# Patient Record
Sex: Female | Born: 2000 | Race: White | Hispanic: No | Marital: Married | Smoking: Never smoker
Health system: Southern US, Community
[De-identification: ages and names within clinical notes are randomized; demographics above are authoritative.]

## PROBLEM LIST (undated history)

## (undated) DIAGNOSIS — Z789 Other specified health status: Secondary | ICD-10-CM

## (undated) HISTORY — PX: OTHER SURGICAL HISTORY: SHX169

## (undated) HISTORY — DX: Other specified health status: Z78.9

---
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
## 2021-03-24 DIAGNOSIS — M25362 Other instability, left knee: Secondary | ICD-10-CM | POA: Diagnosis not present

## 2021-03-24 DIAGNOSIS — M2202 Recurrent dislocation of patella, left knee: Secondary | ICD-10-CM | POA: Diagnosis not present

## 2021-03-24 DIAGNOSIS — M794 Hypertrophy of (infrapatellar) fat pad: Secondary | ICD-10-CM | POA: Diagnosis not present

## 2021-03-24 DIAGNOSIS — M2352 Chronic instability of knee, left knee: Secondary | ICD-10-CM | POA: Diagnosis not present

## 2021-03-24 DIAGNOSIS — M65862 Other synovitis and tenosynovitis, left lower leg: Secondary | ICD-10-CM | POA: Diagnosis not present

## 2021-03-24 DIAGNOSIS — G8918 Other acute postprocedural pain: Secondary | ICD-10-CM | POA: Diagnosis not present

## 2021-03-31 DIAGNOSIS — M25562 Pain in left knee: Secondary | ICD-10-CM | POA: Insufficient documentation

## 2021-04-05 DIAGNOSIS — M25562 Pain in left knee: Secondary | ICD-10-CM | POA: Diagnosis not present

## 2021-04-07 DIAGNOSIS — M25562 Pain in left knee: Secondary | ICD-10-CM | POA: Diagnosis not present

## 2021-04-12 DIAGNOSIS — M25562 Pain in left knee: Secondary | ICD-10-CM | POA: Diagnosis not present

## 2021-04-15 DIAGNOSIS — M25562 Pain in left knee: Secondary | ICD-10-CM | POA: Diagnosis not present

## 2021-04-22 DIAGNOSIS — M25562 Pain in left knee: Secondary | ICD-10-CM | POA: Diagnosis not present

## 2021-04-26 DIAGNOSIS — M25562 Pain in left knee: Secondary | ICD-10-CM | POA: Diagnosis not present

## 2021-04-29 DIAGNOSIS — M25562 Pain in left knee: Secondary | ICD-10-CM | POA: Diagnosis not present

## 2021-05-06 DIAGNOSIS — M25562 Pain in left knee: Secondary | ICD-10-CM | POA: Diagnosis not present

## 2021-05-10 DIAGNOSIS — M25562 Pain in left knee: Secondary | ICD-10-CM | POA: Diagnosis not present

## 2021-05-13 DIAGNOSIS — M25562 Pain in left knee: Secondary | ICD-10-CM | POA: Diagnosis not present

## 2021-05-17 DIAGNOSIS — M25562 Pain in left knee: Secondary | ICD-10-CM | POA: Diagnosis not present

## 2021-05-20 DIAGNOSIS — M25562 Pain in left knee: Secondary | ICD-10-CM | POA: Diagnosis not present

## 2021-05-31 DIAGNOSIS — M25562 Pain in left knee: Secondary | ICD-10-CM | POA: Diagnosis not present

## 2021-05-31 DIAGNOSIS — M24462 Recurrent dislocation, left knee: Secondary | ICD-10-CM | POA: Diagnosis not present

## 2021-06-07 DIAGNOSIS — M25562 Pain in left knee: Secondary | ICD-10-CM | POA: Diagnosis not present

## 2021-06-10 DIAGNOSIS — M25562 Pain in left knee: Secondary | ICD-10-CM | POA: Diagnosis not present

## 2021-06-14 DIAGNOSIS — M25562 Pain in left knee: Secondary | ICD-10-CM | POA: Diagnosis not present

## 2021-06-16 DIAGNOSIS — M25562 Pain in left knee: Secondary | ICD-10-CM | POA: Diagnosis not present

## 2021-07-01 DIAGNOSIS — L03116 Cellulitis of left lower limb: Secondary | ICD-10-CM | POA: Diagnosis not present

## 2021-08-12 DIAGNOSIS — Z9889 Other specified postprocedural states: Secondary | ICD-10-CM

## 2021-08-12 HISTORY — DX: Other specified postprocedural states: Z98.890

## 2021-10-18 DIAGNOSIS — Z3201 Encounter for pregnancy test, result positive: Secondary | ICD-10-CM | POA: Diagnosis not present

## 2021-11-03 DIAGNOSIS — Z3400 Encounter for supervision of normal first pregnancy, unspecified trimester: Secondary | ICD-10-CM | POA: Diagnosis not present

## 2021-11-08 DIAGNOSIS — O26891 Other specified pregnancy related conditions, first trimester: Secondary | ICD-10-CM | POA: Diagnosis not present

## 2021-11-10 ENCOUNTER — Ambulatory Visit: Payer: BC Managed Care – PPO

## 2021-11-17 DIAGNOSIS — Z3401 Encounter for supervision of normal first pregnancy, first trimester: Secondary | ICD-10-CM | POA: Diagnosis not present

## 2021-11-23 ENCOUNTER — Other Ambulatory Visit: Payer: BC Managed Care – PPO

## 2021-12-23 DIAGNOSIS — Z3480 Encounter for supervision of other normal pregnancy, unspecified trimester: Secondary | ICD-10-CM | POA: Insufficient documentation

## 2021-12-23 DIAGNOSIS — Z3490 Encounter for supervision of normal pregnancy, unspecified, unspecified trimester: Secondary | ICD-10-CM | POA: Insufficient documentation

## 2021-12-23 NOTE — Progress Notes (Unsigned)
New OB Intake  I connected with  Jane Cordova on 12/24/21 at 11:15 AM EST by telephone and verified that I am speaking with the correct person using two identifiers. Nurse is located at [facility] and pt is located at home.  I explained I am completing New OB Intake today. We discussed her EDD of 06/17/22 that is based on LMP of 09/10/21. Pt is G1/P0. I reviewed her allergies, medications, Medical/Surgical/OB history, and appropriate screenings. Based on history, this is a/an pregnancy uncomplicated .   There are no problems to display for this patient.   Concerns addressed today: None  Delivery Plans:  Plans to deliver at [facility].  Anatomy US Explained  Anatomy US will be at 20 weeks.  Labs Discussed genetic screening with patient. Patient denies genetic testing to be drawn with new OB labs. Discussed possible labs to be drawn at new OB appointment.  COVID Vaccine Patient has not had COVID vaccine.   Social Determinants of Health Food Insecurity: denies food insecurity Transportation: Patient denies transportation needs. Childcare: Discussed no children allowed at ultrasound appointments.   First visit review I reviewed new OB appt with pt. I explained she will have bloodwork and pap smear/pelvic exam if indicated. Explained pt will be seen by Tresea Mall, CNM at first visit; encounter routed to appropriate provider.   Donnetta Hail, CMA 12/24/2021  11:15 AM

## 2021-12-24 ENCOUNTER — Ambulatory Visit (INDEPENDENT_AMBULATORY_CARE_PROVIDER_SITE_OTHER): Payer: Medicaid Other

## 2021-12-24 VITALS — Ht 67.0 in

## 2021-12-24 DIAGNOSIS — Z3689 Encounter for other specified antenatal screening: Secondary | ICD-10-CM

## 2021-12-24 DIAGNOSIS — Z3480 Encounter for supervision of other normal pregnancy, unspecified trimester: Secondary | ICD-10-CM

## 2021-12-27 ENCOUNTER — Other Ambulatory Visit: Payer: BC Managed Care – PPO

## 2021-12-29 ENCOUNTER — Other Ambulatory Visit: Payer: Self-pay | Admitting: Advanced Practice Midwife

## 2021-12-29 DIAGNOSIS — Z3402 Encounter for supervision of normal first pregnancy, second trimester: Secondary | ICD-10-CM

## 2021-12-29 DIAGNOSIS — Z369 Encounter for antenatal screening, unspecified: Secondary | ICD-10-CM

## 2021-12-29 NOTE — Progress Notes (Signed)
Anatomy scan ordered

## 2022-01-04 ENCOUNTER — Ambulatory Visit (INDEPENDENT_AMBULATORY_CARE_PROVIDER_SITE_OTHER): Payer: Medicaid Other | Admitting: Advanced Practice Midwife

## 2022-01-04 ENCOUNTER — Other Ambulatory Visit (HOSPITAL_COMMUNITY)
Admission: RE | Admit: 2022-01-04 | Discharge: 2022-01-04 | Disposition: A | Discharge status: Home / Self Care | Payer: Medicaid Other | Source: Ambulatory Visit | Attending: Advanced Practice Midwife | Admitting: Advanced Practice Midwife

## 2022-01-04 ENCOUNTER — Encounter: Payer: Self-pay | Admitting: Advanced Practice Midwife

## 2022-01-04 VITALS — BP 90/60 | HR 87 | Wt 149.0 lb

## 2022-01-04 DIAGNOSIS — Z3402 Encounter for supervision of normal first pregnancy, second trimester: Secondary | ICD-10-CM | POA: Diagnosis not present

## 2022-01-04 DIAGNOSIS — Z113 Encounter for screening for infections with a predominantly sexual mode of transmission: Secondary | ICD-10-CM | POA: Insufficient documentation

## 2022-01-04 DIAGNOSIS — Z3A16 16 weeks gestation of pregnancy: Secondary | ICD-10-CM | POA: Diagnosis not present

## 2022-01-04 LAB — POCT URINALYSIS DIPSTICK OB
Bilirubin, UA: NEGATIVE
Blood, UA: NEGATIVE
Glucose, UA: NEGATIVE
Ketones, UA: NEGATIVE
Leukocytes, UA: NEGATIVE
Nitrite, UA: NEGATIVE
POC,PROTEIN,UA: NEGATIVE
Spec Grav, UA: 1.01 (ref 1.010–1.025)
Urobilinogen, UA: 1 E.U./dL
pH, UA: 6.5 (ref 5.0–8.0)

## 2022-01-04 NOTE — Progress Notes (Unsigned)
New Obstetric Patient H&P  Transfer of care from [facility] in [place]: "Desires prenatal care"   History of Present Illness: Patient is a 21 y.o. G1P0 Not Hispanic or Latino female, presents with amenorrhea and positive home pregnancy test. Patient's last menstrual period was 09/10/2021 (approximate). and based on her  LMP, her EDD is Estimated Date of Delivery: 06/16/22 and her EGA is [redacted]w[redacted]d Cycles are {0-35:19561} {days/wks/mos/yrs:310907}, {Desc; regular/irreg:14544}, and occur approximately every : {numbers 22-35:14824} days. Her last pap smear was {numbers (fuzzy):14653} years ago and was {Findings; lab pap smear results:16707::"no abnormalities"}.    She had a urine pregnancy test which was positive {numbers (fuzzy):14653} {time frame:9076}  ago. Her last menstrual period was normal and lasted for  {numbers (fuzzy):14653} {time frame:9076}. Since her LMP she claims she has experienced ***. She denies vaginal bleeding. Her past medical history is {Noncontribuatory/Contributory:21644}. Her prior pregnancies are notable for {pregnancy complications:12320}  Since her LMP, she admits to the use of tobacco products  {yes/no:63} She claims she has gained   {inf wt change:14817} pounds since the start of her pregnancy.  There are cats in the home in the home  {yes/no:63} If yes {Desc; indoor/outdoor:13239} She admits close contact with children on a regular basis  {yes/no:63}  She has had chicken pox in the past {yes/no/unknown:74} She has had Tuberculosis exposures, symptoms, or previously tested positive for TB   {yes/no:63} Current or past history of domestic violence. {yes/no:63}  Genetic Screening/Teratology Counseling: (Includes patient, baby's father, or anyone in either family with:)   1. [facility] {yes/no:63} 2. Thalassemia ([place] or [place] background): MCV<80  {yes/no:63} 3. Neural tube defect (meningomyelocele, spina  bifida, anencephaly)  {yes/no:63} 4. Congenital heart defect  {yes/no:63}  5. Down syndrome  {yes/no:63} 6. Tay-Sachs (Jewish, [place]  {yes/no:63} 7. Canavan's Disease  {yes/no:63} 8. Sickle cell disease or trait (African)  {yes/no:63}  9. Hemophilia or other blood disorders  {yes/no:63}  10. Muscular dystrophy  {yes/no:63}  11. Cystic fibrosis  {yes/no:63}  12. Huntington's Chorea  {yes/no:63}  13. Mental retardation/autism  {yes/no:63} 14. Other inherited genetic or chromosomal disorder  {yes/no:63} 15. Maternal metabolic disorder (DM, PKU, etc)  {yes/no:63} 16. Patient or FOB with a child with a birth defect not listed above no  16a. Patient or FOB with a birth defect themselves {yes/no:63} 128 Recurrent pregnancy loss, or stillbirth  {yes/no:63}  18. Any medications since LMP other than prenatal vitamins (include vitamins, supplements, OTC meds, drugs, alcohol)  {yes/no:63} 19. Any other genetic/environmental exposure to discuss  {yes/no:63}  Infection History:   1. Lives with someone with TB or TB exposed  {yes/no:63}  2. Patient or partner has history of genital herpes  {yes/no:63} 3. Rash or viral illness since LMP  {yes/no:63} 4. History of STI (GC, CT, HPV, syphilis, HIV)  {yes/no:63} 5. History of recent travel :  {yes/no:63}  Other pertinent information:  {yes/no:63}     Review of Systems:10 point review of systems negative unless otherwise noted in HPI  Past Medical History:  Patient Active Problem List   Diagnosis Date Noted   Supervision of normal pregnancy 12/23/2021     Clinical Staff Provider  Office Location  [facility] Dating  Not found.  Language  English Anatomy [place]   Flu Vaccine  Offer Genetic Screen  NIPS:   TDaP vaccine   Offer Hgb A1C or  GTT Early : Third trimester :   Covid  Declines   LAB RESULTS   Rhogam     Blood Type     Feeding Plan Breastfeed Antibody    Contraception Declines Rubella    Circumcision Yes RPR      Pediatrician  Undecided HBsAg     Support Person Dorothea Ogle and Mom HIV    Prenatal Classes Yes Varicella     GBS  (For PCN allergy, check sensitivities)   BTL Consent  Hep C     VBAC Consent  Pap No results found for: "DIAGPAP"    Hgb Electro      CF      SMA            History of arthroscopy of knee 08/12/2021   Pain in joint of left knee 03/31/2021    Past Surgical History:  Past Surgical History:  Procedure Laterality Date   OTHER SURGICAL HISTORY     Ligament Reconstruction on Left Knee    Gynecologic History: Patient's last menstrual period was 09/10/2021 (approximate).  Obstetric History: G1P0  Family History:  Family History  Problem Relation Age of Onset   Healthy Father    Healthy Brother    Diabetes Maternal Grandmother    Heart Problems Maternal Grandmother    Diabetes Maternal Grandfather    Lung cancer Paternal Grandmother    Skin cancer Paternal Grandmother    Skin cancer Paternal Grandfather    Breast cancer Maternal Great-grandmother 42   Multiple myeloma Maternal Great-grandmother 65    Social History:  Social History   Socioeconomic History   Marital status: Single    Spouse name: Not on file   Number of children: 0   Years of education: 13   Highest education level: High school graduate  Occupational History   Occupation: Stay At Home  Tobacco Use   Smoking status: Never   Smokeless tobacco: Never  Vaping Use   Vaping Use: Former   Substances: Nicotine  Substance and Sexual Activity   Alcohol use: Not Currently   Drug use: Not Currently   Sexual activity: Yes    Partners: Male    Birth control/protection: None  Other Topics Concern   Not on file  Social History Narrative   Not on file   Social Determinants of Health   Financial Resource Strain: Low Risk  (12/24/2021)   Overall Financial Resource Strain (CARDIA)    Difficulty of Paying Living Expenses: Not very hard  Food Insecurity: No Food Insecurity (12/24/2021)   Hunger  Vital Sign    Worried About Running Out of Food in the Last Year: Never true    Ran Out of Food in the Last Year: Never true  Transportation Needs: No Transportation Needs (12/24/2021)   PRAPARE - Hydrologist (Medical): No    Lack of Transportation (Non-Medical): No  Physical Activity: Insufficiently Active (12/24/2021)   Exercise Vital Sign    Days of Exercise per Week: 1 day    Minutes of Exercise per Session: 10 min  Stress: No Stress Concern Present (12/24/2021)   [place]    Feeling of Stress : Only a little  Social Connections: Moderately Integrated (12/24/2021)   Social Connection and Isolation Panel [NHANES]    Frequency of Communication with Friends and Family: More than three times a week    Frequency of Social Gatherings with Friends and Family: Twice a week    Attends Religious Services: More than 4 times per  year    Active Member of Clubs or Organizations: No    Attends Archivist Meetings: Never    Marital Status: Living with partner  Intimate Partner Violence: Not At Risk (12/24/2021)   Humiliation, Afraid, Rape, and Kick questionnaire    Fear of Current or Ex-Partner: No    Emotionally Abused: No    Physically Abused: No    Sexually Abused: No    Allergies:  No Known Allergies  Medications: Prior to Admission medications   Medication Sig Start Date End Date Taking? Authorizing Provider  Prenatal Vit-Fe Fumarate-FA (PRENATAL PO) Take by mouth.   Yes [provider]    Physical Exam Vitals: Blood pressure 90/60, pulse 87, weight 149 lb (67.6 kg), last menstrual period 09/10/2021.  General: NAD HEENT: normocephalic, anicteric Thyroid: no enlargement, no palpable nodules Pulmonary: No increased work of breathing, CTAB Cardiovascular: RRR, distal pulses 2+ Abdomen: NABS, soft, non-tender, non-distended.  Umbilicus without lesions.  No hepatomegaly,  splenomegaly or masses palpable. No evidence of hernia  Genitourinary:  External: Normal external female genitalia.  Normal urethral meatus, normal  Bartholin's and Skene's glands.    Vagina: Normal vaginal mucosa, no evidence of prolapse.    Cervix: Grossly normal in appearance, no bleeding  Uterus: *** Non-enlarged, mobile, normal contour.  No CMT  Adnexa: ovaries non-enlarged, no adnexal masses  Rectal: deferred Extremities: no edema, erythema, or tenderness Neurologic: Grossly intact Psychiatric: mood appropriate, affect full   The following were addressed during this visit:  Breastfeeding Education - Early initiation of breastfeeding    Comments: Keeps milk supply adequate, helps contract uterus and slow bleeding, and early milk is the perfect first food and is easy to digest.   - The importance of exclusive breastfeeding    Comments: Provides antibodies, Lower risk of breast and ovarian cancers, and type-2 diabetes,Helps your body recover, Reduced chance of SIDS.   - Risks of giving your baby anything other than breast milk if you are breastfeeding    Comments: Make the baby less content with breastfeeds, may make my baby more susceptible to illness, and may reduce my milk supply.   - The importance of early skin-to-skin contact    Comments:  Keeps baby warm and secure, helps keep baby's blood sugar up and breathing steady, easier to bond and breastfeed, and helps calm baby.  - Rooming-in on a 24-hour basis    Comments: Easier to learn baby's feeding cues, easier to bond and get to know each other, and encourages milk production.   - Feeding on demand or baby-led feeding    Comments: Helps prevent breastfeeding complications, helps bring in good milk supply, prevents under or overfeeding, and helps baby feel content and satisfied   - Frequent feeding to help assure optimal milk production    Comments: Making a full supply of milk requires frequent removal of milk from  breasts, infant will eat 8-12 times in 24 hours, if separated from infant use breast massage, hand expression and/ or pumping to remove milk from breasts.   - Effective positioning and attachment    Comments: Helps my baby to get enough breast milk, helps to produce an adequate milk supply, and helps prevent nipple pain and damage   - Exclusive breastfeeding for the first 6 months    Comments: Builds a healthy milk supply and keeps it up, protects baby from sickness and disease, and breastmilk has everything your baby needs for the first 6 months.    Assessment: 20  y.o. G1P0 at [id number] to initiate prenatal care  Plan: 1) Avoid alcoholic beverages. 2) Patient encouraged not to smoke.  3) Discontinue the use of all non-medicinal drugs and chemicals.  4) Take prenatal vitamins daily.  5) Nutrition, food safety (fish, cheese advisories, and high nitrite foods) and exercise discussed. 6) Hospital and practice style discussed with cross coverage system.  7) Genetic Screening, such as with 1st Trimester Screening, cell free fetal DNA, AFP testing, and Ultrasound, as well as with amniocentesis and CVS as appropriate, is discussed with patient. At the conclusion of today's visit patient {Desc; requested/declined/undecided:14580} genetic testing 8) Patient is asked about travel to areas at risk for the [place], and counseled to avoid travel and exposure to mosquitoes or sexual partners who may have themselves been exposed to the virus. Testing is discussed, and will be ordered as appropriate.   AMalachy Mood MD, [facility]N, [facility] 01/04/2022, 4:09 PM

## 2022-01-04 NOTE — Patient Instructions (Addendum)
Exercise During Pregnancy Exercise is an important part of being healthy for people of all ages. Exercise improves the function of your heart and lungs and helps you maintain strength, flexibility, and a healthy body weight. Exercise also boosts energy levels and elevates mood. Most women should exercise regularly during pregnancy. Exercise routines may need to change as your pregnancy progresses. In rare cases, women with certain medical conditions or complications may be asked to limit or avoid exercise during pregnancy. Your health care provider will give you information on what will work for you. How does this affect me? Along with maintaining general strength and flexibility, exercising during pregnancy can help: Keep strength in muscles that are used during labor and childbirth. Decrease low back pain or symptoms of depression. Control weight gain during pregnancy. Reduce the risk of needing insulin if you develop diabetes during pregnancy. Decrease the risk of cesarean delivery. Speed up your recovery after giving birth. Relieve constipation. How does this affect my baby? Exercise can help you have a healthy pregnancy. Exercise does not cause early (premature) birth. It will not cause your baby to weigh less at birth. What exercises can I do? Many exercises are safe for you to do during pregnancy. Do a variety of exercises that safely increase your heart and breathing rates and help you build and maintain muscle strength. Do exercises exactly as told by your health care provider. You may do these exercises: Walking. Swimming. Water aerobics. Riding a stationary bike. Modified yoga or Pilates. Tell your instructor that you are pregnant. Avoid overstretching, and avoid lying on your back for long periods of time. Running or jogging. Choose this type of exercise only if: You ran or jogged regularly before your pregnancy. You can run or jog and still talk in complete sentences. What  exercises should I avoid? You may be told to limit high-intensity exercise depending on your level of fitness and whether you exercised regularly before you were pregnant. You can tell that you are exercising at a high intensity if you are breathing much harder and faster and cannot hold a conversation while exercising. You must avoid: Contact sports. Activities that put you at risk for falling on or being hit in the belly, such as downhill skiing, waterskiing, surfing, rock climbing, cycling, gymnastics, and horseback riding. Scuba diving. Skydiving. Hot yoga or hot Pilates. These activities take place in a room that is heated to high temperatures. Jogging or running, unless you jogged or ran regularly before you were pregnant. While jogging or running, you should always be able to talk in full sentences. Do not run or jog so fast that you are unable to have a conversation. Do not exercise at more than 6,000 feet above sea level (high elevation) if you are not used to exercising at high elevation. How do I exercise in a safe way?  Avoid overheating. Do not exercise in very high temperatures. Wear loose-fitting, breathable clothes. Avoid dehydration. Drink enough fluid before, during, and after exercise to keep your urine pale yellow. Avoid overstretching. Because of hormone changes during pregnancy, it is easy to overstretch muscles, tendons, and ligaments. Start slowly and ask your health care provider to recommend the types of exercise that are safe for you. Do not exercise to lose weight. Wear a sports bra to support your breasts. Avoid standing still or lying flat on your back as much as you can. Follow these instructions at home: Exercise on most days or all days of the week. Try to   exercise for 30 minutes a day, 5 days a week, unless your health care provider tells you not to. If you actively exercised before your pregnancy and you are healthy, your health care provider may tell you to  continue to do moderate-intensity to high-intensity exercise. If you are just starting to exercise or did not exercise much before your pregnancy, your health care provider may tell you to do low-intensity to moderate-intensity exercise. Questions to ask your health care provider Is exercise safe for me? What are signs that I should stop exercising? Does my health condition mean that I should not exercise during pregnancy? When should I avoid exercising during pregnancy? Stop exercising and contact a health care provider if: You have any unusual symptoms such as: Mild contractions of the uterus or cramps in the abdomen. A dizzy feeling that does not go away when you rest. Stop exercising and get help right away if: You have any unusual symptoms such as: Sudden, severe pain in your low back or your belly. Regular, painful contractions of your uterus. Chest pain. Bleeding or fluid leaking from your vagina. Shortness of breath. Headache. Pain and swelling of your calves. Summary Most women should exercise regularly throughout pregnancy. In rare cases, women with certain medical conditions or complications may be asked to limit or avoid exercise during pregnancy. Do not exercise to lose weight during pregnancy. Your health care provider will tell you what level of physical activity is right for you. Stop exercising and contact a health care provider if you have unusual symptoms, such as mild contractions or dizziness. This information is not intended to replace advice given to you by your health care provider. Make sure you discuss any questions you have with your health care provider. Document Revised: 08/28/2019 Document Reviewed: 08/28/2019 Elsevier Patient Education  2023 [employer] Eating Plan for Pregnant Women While you are pregnant, your body requires additional nutrition to help support your growing baby. You also have a higher need for some vitamins and minerals, such as folic  acid, calcium, iron, and vitamin D. Eating a healthy, well-balanced diet is very important for your health and your baby's health. Your need for extra calories varies over the course of your pregnancy. Pregnancy is divided into three trimesters, with each trimester lasting 3 months. For most women, it is recommended to consume: 150 extra calories a day during the first trimester. 300 extra calories a day during the second trimester. 300 extra calories a day during the third trimester. What are tips for following this plan? Cooking Practice good food safety and cleanliness. Wash your hands before you eat and after you prepare raw meat. Wash all fruits and vegetables well before peeling or eating. Taking these actions can help to prevent foodborne illnesses that can be very dangerous to your baby, such as listeriosis. Ask your health care provider for more information about listeriosis. Make sure that all meats, poultry, and eggs are cooked to food-safe temperatures or "well-done." Meal planning  Eat a variety of foods (especially fruits and vegetables) to get a full range of vitamins and minerals. Two or more servings of fish are recommended each week in order to get the most benefits from omega-3 fatty acids that are found in seafood. Choose fish that are lower in mercury, such as salmon and pollock. Limit your overall intake of foods that have "empty calories." These are foods that have little nutritional value, such as sweets, desserts, candies, and sugar-sweetened beverages. Drinks that contain caffeine are okay   to drink, but it is better to avoid caffeine. Keep your total caffeine intake to less than 200 mg each day (which is 12 oz or 355 mL of coffee, tea, or soda) or the limit as told by your health care provider. General information Do not try to lose weight or go on a diet during pregnancy. Take a prenatal vitamin to help meet your additional vitamin and mineral needs during pregnancy,  specifically for folic acid, iron, calcium, and vitamin D. Remember to stay active. Ask your health care provider what types of exercise and activities are safe for you. What does 150 extra calories look like? Healthy options that provide 150 extra calories each day could be any of the following: 6-8 oz (170-227 g) plain low-fat yogurt with  cup (70 g) berries. 1 apple with 2 tsp (11 g) peanut butter. Cut-up vegetables with  cup (60 g) hummus. 8 fl oz (237 mL) low-fat chocolate milk. 1 stick of string cheese with 1 medium orange. 1 peanut butter and jelly sandwich that is made with one slice of whole-wheat bread and 1 tsp (5 g) of peanut butter. For 300 extra calories, you could eat two of these healthy options each day. What is a healthy amount of weight to gain? The right amount of weight gain for you is based on your BMI (body mass index) before you became pregnant. If your BMI was less than 18 (underweight), you should gain 28-40 lb (13-18 kg). If your BMI was 18-24.9 (normal), you should gain 25-35 lb (11-16 kg). If your BMI was 25-29.9 (overweight), you should gain 15-25 lb (7-11 kg). If your BMI was 30 or greater (obese), you should gain 11-20 lb (5-9 kg). What if I am having twins or multiples? Generally, if you are carrying twins or multiples: You may need to eat 300-600 extra calories a day. The recommended range for total weight gain is 25-54 lb (11-25 kg), depending on your BMI before pregnancy. Talk with your health care provider to find out about nutritional needs, weight gain, and exercise that is right for you. What foods should I eat?  Fruits All fruits. Eat a variety of colors and types of fruit. Remember to wash your fruits well before peeling or eating. Vegetables All vegetables. Eat a variety of colors and types of vegetables. Remember to wash your vegetables well before peeling or eating. Grains All grains. Choose whole grains, such as whole-wheat bread, oatmeal,  or brown rice. Meats and other protein foods Lean meats, including chicken, turkey, and lean cuts of beef, veal, or pork. Fish that is higher in omega-3 fatty acids and lower in mercury, such as salmon, herring, mussels, trout, sardines, pollock, shrimp, crab, and lobster. Tofu. Tempeh. Beans. Eggs. Peanut butter and other nut butters. Dairy Pasteurized milk and milk alternatives, such as almond milk. Pasteurized yogurt and pasteurized cheese. Cottage cheese. Sour cream. Beverages Water. Juices that contain 100% fruit juice or vegetable juice. Caffeine-free teas and decaffeinated coffee. Fats and oils Fats and oils are okay to include in moderation. Sweets and desserts Sweets and desserts are okay to include in moderation. Seasoning and other foods All pasteurized condiments. The items listed above may not be a complete list of foods and beverages you can eat. Contact a dietitian for more information. What foods should I avoid? Fruits Raw (unpasteurized) fruit juices. Vegetables Unpasteurized vegetable juices. Meats and other protein foods Precooked or cured meat, such as bologna, hot dogs, sausages, or meat loaves. (If you must eat   those meats, reheat them until they are steaming hot.) Refrigerated pate, meat spreads from a meat counter, or smoked seafood that is found in the refrigerated section of a store. Raw or undercooked meats, poultry, and eggs. Raw fish, such as sushi or sashimi. Fish that have high mercury content, such as tilefish, shark, swordfish, and king mackerel. Dairy Unpasteurized milk and any foods that have unpasteurized milk in them. Soft cheeses, such as feta, queso blanco, queso fresco, Brie, Camembert, panela, and blue-veined cheeses (unless they are made with pasteurized milk, which must be stated on the label). Beverages Alcohol. Sugar-sweetened beverages, such as sodas, teas, or energy drinks. Seasoning and other foods Homemade fermented foods and drinks, such as  pickles, sauerkraut, or kombucha drinks. (Store-bought pasteurized versions of these are okay.) Salads that are made in a store or deli, such as ham salad, chicken salad, egg salad, tuna salad, and seafood salad. The items listed above may not be a complete list of foods and beverages you should avoid. Contact a dietitian for more information. Where to find more information To calculate the number of calories you need based on your height, weight, and activity level, you can use an online calculator such as: www.myplate.gov/myplate-plan To calculate how much weight you should gain during pregnancy, you can use an online pregnancy weight gain calculator such as: www.myplate.gov To learn more about eating fish during pregnancy, talk with your health care provider or visit: www.fda.gov Summary While you are pregnant, your body requires additional nutrition to help support your growing baby. Eat a variety of foods, especially fruits and vegetables, to get a full range of vitamins and minerals. Practice good food safety and cleanliness. Wash your hands before you eat and after you prepare raw meat. Wash all fruits and vegetables well before peeling or eating. Taking these actions can help to prevent foodborne illnesses, such as listeriosis, that can be very dangerous to your baby. Do not eat raw meat or fish. Do not eat fish that have high mercury content, such as tilefish, shark, swordfish, and king mackerel. Do not eat raw (unpasteurized) dairy. Take a prenatal vitamin to help meet your additional vitamin and mineral needs during pregnancy, specifically for folic acid, iron, calcium, and vitamin D. This information is not intended to replace advice given to you by your health care provider. Make sure you discuss any questions you have with your health care provider. Document Revised: 08/08/2019 Document Reviewed: 08/08/2019 Elsevier Patient Education  2023 [employer] Prenatal Care Prenatal care  is health care during pregnancy. It helps you and your unborn baby (fetus) stay as healthy as possible. Prenatal care may be provided by a midwife, a family practice doctor, a mid-level practitioner (nurse practitioner or physician assistant), or a childbirth and pregnancy doctor (obstetrician). How does this affect me? During pregnancy, you will be closely monitored for any new conditions that might develop. To lower your risk of pregnancy complications, you and your health care provider will talk about any underlying conditions you have. How does this affect my baby? Early and consistent prenatal care increases the chance that your baby will be healthy during pregnancy. Prenatal care lowers the risk that your baby will be: Born early (prematurely). Smaller than expected at birth (small for gestational age). What can I expect at the first prenatal care visit? Your first prenatal care visit will likely be the longest. You should schedule your first prenatal care visit as soon as you know that you are pregnant. Your first visit   is a good time to talk about any questions or concerns you have about pregnancy. Medical history At your visit, you and your health care provider will talk about your medical history, including: Any past pregnancies. Your family's medical history. Medical history of the baby's father. Any long-term (chronic) health conditions you have and how you manage them. Any surgeries or procedures you have had. Any current over-the-counter or prescription medicines, herbs, or supplements that you are taking. Other factors that could pose a risk to your baby, including: Exposure to harmful chemicals or radiation at work or at home. Any substance use, including tobacco, alcohol, and drug use. Your home setting and your stress levels, including: Exposure to abuse or violence. Household financial strain. Your daily health habits, including diet and exercise. Tests and screenings Your  health care provider will: Measure your weight, height, and blood pressure. Do a physical exam, including a pelvic and breast exam. Perform blood tests and urine tests to check for: Urinary tract infection. Sexually transmitted infections (STIs). Low iron levels in your blood (anemia). Blood type and certain proteins on red blood cells (Rh antibodies). Infections and immunity to viruses, such as hepatitis B and rubella. HIV (human immunodeficiency virus). Discuss your options for genetic screening. Tips about staying healthy Your health care provider will also give you information about how to keep yourself and your baby healthy, including: Nutrition and taking vitamins. Physical activity. How to manage pregnancy symptoms such as nausea and vomiting (morning sickness). Infections and substances that may be harmful to your baby and how to avoid them. Food safety. Dental care. Working. Travel. Warning signs to watch for and when to call your health care provider. How often will I have prenatal care visits? After your first prenatal care visit, you will have regular visits throughout your pregnancy. The visit schedule is often as follows: Up to week 28 of pregnancy: once every 4 weeks. 28-36 weeks: once every 2 weeks. After 36 weeks: every week until delivery. Some women may have visits more or less often depending on any underlying health conditions and the health of the baby. Keep all follow-up and prenatal care visits. This is important. What happens during routine prenatal care visits? Your health care provider will: Measure your weight and blood pressure. Check for fetal heart sounds. Measure the height of your uterus in your abdomen (fundal height). This may be measured starting around week 20 of pregnancy. Check the position of your baby inside your uterus. Ask questions about your diet, sleeping patterns, and whether you can feel the baby move. Review warning signs to watch  for and signs of labor. Ask about any pregnancy symptoms you are having and how you are dealing with them. Symptoms may include: Headaches. Nausea and vomiting. Vaginal discharge. Swelling. Fatigue. Constipation. Changes in your vision. Feeling persistently sad or anxious. Any discomfort, including back or pelvic pain. Bleeding or spotting. Make a list of questions to ask your health care provider at your routine visits. What tests might I have during prenatal care visits? You may have blood, urine, and imaging tests throughout your pregnancy, such as: Urine tests to check for glucose, protein, or signs of infection. Glucose tests to check for a form of diabetes that can develop during pregnancy (gestational diabetes mellitus). This is usually done around week 24 of pregnancy. Ultrasounds to check your baby's growth and development, to check for birth defects, and to check your baby's well-being. These can also help to decide when you should deliver   your baby. A test to check for group B strep (GBS) infection. This is usually done around week 36 of pregnancy. Genetic testing. This may include blood, fluid, or tissue sampling, or imaging tests, such as an ultrasound. Some genetic tests are done during the first trimester and some are done during the second trimester. What else can I expect during prenatal care visits? Your health care provider may recommend getting certain vaccines during pregnancy. These may include: A yearly flu shot (annual influenza vaccine). This is especially important if you will be pregnant during flu season. Tdap (tetanus, diphtheria, pertussis) vaccine. Getting this vaccine during pregnancy can protect your baby from whooping cough (pertussis) after birth. This vaccine may be recommended between weeks 27 and 36 of pregnancy. A COVID-19 vaccine. Later in your pregnancy, your health care provider may give you information about: Childbirth and breastfeeding  classes. Choosing a health care provider for your baby. Umbilical cord banking. Breastfeeding. Birth control after your baby is born. The hospital labor and delivery unit and how to set up a tour. Registering at the hospital before you go into labor. Where to find more information Office on Women's Health: womenshealth.gov American Pregnancy Association: americanpregnancy.org March of Dimes: marchofdimes.org Summary Prenatal care helps you and your baby stay as healthy as possible during pregnancy. Your first prenatal care visit will most likely be the longest. You will have visits and tests throughout your pregnancy to monitor your health and your baby's health. Bring a list of questions to your visits to ask your health care provider. Make sure to keep all follow-up and prenatal care visits. This information is not intended to replace advice given to you by your health care provider. Make sure you discuss any questions you have with your health care provider. Document Revised: 10/24/2019 Document Reviewed: 10/24/2019 Elsevier Patient Education  2023 [employer]  Managing Anxiety, Adult After being diagnosed with anxiety, you may be relieved to know why you have felt or behaved a certain way. You may also feel overwhelmed about the treatment ahead and what it will mean for your life. With care and support, you can manage this condition. How to manage lifestyle changes Managing stress and anxiety  Stress is your body's reaction to life changes and events, both good and bad. Most stress will last just a few hours, but stress can be ongoing and can lead to more than just stress. Although stress can play a major role in anxiety, it is not the same as anxiety. Stress is usually caused by something external, such as a deadline, test, or competition. Stress normally passes after the triggering event has ended.  Anxiety is caused by something internal, such as imagining a terrible outcome or  worrying that something will go wrong that will devastate you. Anxiety often does not go away even after the triggering event is over, and it can become long-term (chronic) worry. It is important to understand the differences between stress and anxiety and to manage your stress effectively so that it does not lead to an anxious response. Talk with your health care provider or a counselor to learn more about reducing anxiety and stress. He or she may suggest tension reduction techniques, such as: Music therapy. Spend time creating or listening to music that you enjoy and that inspires you. Mindfulness-based meditation. Practice being aware of your normal breaths while not trying to control your breathing. It can be done while sitting or walking. Centering prayer. This involves focusing on a word, phrase,   or sacred image that means something to you and brings you peace. Deep breathing. To do this, expand your stomach and inhale slowly through your nose. Hold your breath for 3-5 seconds. Then exhale slowly, letting your stomach muscles relax. Self-talk. Learn to notice and identify thought patterns that lead to anxiety reactions and change those patterns to thoughts that feel peaceful. Muscle relaxation. Taking time to tense muscles and then relax them. Choose a tension reduction technique that fits your lifestyle and personality. These techniques take time and practice. Set aside 5-15 minutes a day to do them. Therapists can offer counseling and training in these techniques. The training to help with anxiety may be covered by some insurance plans. Other things you can do to manage stress and anxiety include: Keeping a stress diary. This can help you learn what triggers your reaction and then learn ways to manage your response. Thinking about how you react to certain situations. You may not be able to control everything, but you can control your response. Making time for activities that help you relax and  not feeling guilty about spending your time in this way. Doing visual imagery. This involves imagining or creating mental pictures to help you relax. Practicing yoga. Through yoga poses, you can lower tension and promote relaxation.  Medicines Medicines can help ease symptoms. Medicines for anxiety include: Antidepressant medicines. These are usually prescribed for long-term daily control. Anti-anxiety medicines. These may be added in severe cases, especially when panic attacks occur. Medicines will be prescribed by a health care provider. When used together, medicines, psychotherapy, and tension reduction techniques may be the most effective treatment. Relationships Relationships can play a big part in helping you recover. Try to spend more time connecting with trusted friends and family members. Consider going to couples counseling if you have a partner, taking family education classes, or going to family therapy. Therapy can help you and others better understand your condition. How to recognize changes in your anxiety Everyone responds differently to treatment for anxiety. Recovery from anxiety happens when symptoms decrease and stop interfering with your daily activities at home or work. This may mean that you will start to: Have better concentration and focus. Worry will interfere less in your daily thinking. Sleep better. Be less irritable. Have more energy. Have improved memory. It is also important to recognize when your condition is getting worse. Contact your health care provider if your symptoms interfere with home or work and you feel like your condition is not improving. Follow these instructions at home: Activity Exercise. Adults should do the following: Exercise for at least 150 minutes each week. The exercise should increase your heart rate and make you sweat (moderate-intensity exercise). Strengthening exercises at least twice a week. Get the right amount and quality of  sleep. Most adults need 7-9 hours of sleep each night. Lifestyle  Eat a healthy diet that includes plenty of vegetables, fruits, whole grains, low-fat dairy products, and lean protein. Do not eat a lot of foods that are high in fats, added sugars, or salt (sodium). Make choices that simplify your life. Do not use any products that contain nicotine or tobacco. These products include cigarettes, chewing tobacco, and vaping devices, such as e-cigarettes. If you need help quitting, ask your health care provider. Avoid caffeine, alcohol, and certain over-the-counter cold medicines. These may make you feel worse. Ask your pharmacist which medicines to avoid. General instructions Take over-the-counter and prescription medicines only as told by your health care provider. Keep   all follow-up visits. This is important. Where to find support You can get help and support from these sources: Self-help groups. Online and community organizations. A trusted spiritual leader. Couples counseling. Family education classes. Family therapy. Where to find more information You may find that joining a support group helps you deal with your anxiety. The following sources can help you locate counselors or support groups near you: Mental Health America: www.mentalhealthamerica.net Anxiety and Depression Association of America (ADAA): www.adaa.org National Alliance on Mental Illness (NAMI): www.nami.org Contact a health care provider if: You have a hard time staying focused or finishing daily tasks. You spend many hours a day feeling worried about everyday life. You become exhausted by worry. You start to have headaches or frequently feel tense. You develop chronic nausea or diarrhea. Get help right away if: You have a racing heart and shortness of breath. You have thoughts of hurting yourself or others. If you ever feel like you may hurt yourself or others, or have thoughts about taking your own life, get help  right away. Go to your nearest emergency department or: Call your local emergency services (911 in the [place] Call a suicide crisis helpline, such as the National Suicide Prevention Lifeline at 1-800-273-8255 or 988 in the [place] This is open 24 hours a day in the [place] Text the Crisis Text Line at 741741 (in the [place] Summary Taking steps to learn and use tension reduction techniques can help calm you and help prevent triggering an anxiety reaction. When used together, medicines, psychotherapy, and tension reduction techniques may be the most effective treatment. Family, friends, and partners can play a big part in supporting you. This information is not intended to replace advice given to you by your health care provider. Make sure you discuss any questions you have with your health care provider. Document Revised: 08/05/2020 Document Reviewed: 05/03/2020 Elsevier Patient Education  2023 Elsevier Inc. Managing Depression, Adult Depression is a mental health condition that affects your thoughts, feelings, and actions. Being diagnosed with depression can bring you relief if you did not know why you have felt or behaved a certain way. It could also leave you feeling overwhelmed with uncertainty about your future. Preparing yourself to manage your symptoms can help you feel more positive about your future. How to manage lifestyle changes Managing stress  Stress is your body's reaction to life changes and events, both good and bad. Stress can add to your feelings of depression. Learning to manage your stress can help lessen your feelings of depression. Try some of the following approaches to reducing your stress (stress reduction techniques): Listen to music that you enjoy and that inspires you. Try using a meditation app or take a meditation class. Develop a practice that helps you connect with your spiritual self. Walk in nature, pray, or go to a place of worship. Do some deep breathing. To do  this, inhale slowly through your nose. Pause at the top of your inhale for a few seconds and then exhale slowly, letting your muscles relax. Practice yoga to help relax and work your muscles. Choose a stress reduction technique that suits your lifestyle and personality. These techniques take time and practice to develop. Set aside 5-15 minutes a day to do them. Therapists can offer training in these techniques. Other things you can do to manage stress include: Keeping a stress diary. Knowing your limits and saying no when you think something is too much. Paying attention to how you react to certain situations. You   may not be able to control everything, but you can change your reaction. Adding humor to your life by watching funny films or TV shows. Making time for activities that you enjoy and that relax you.  Medicines Medicines, such as antidepressants, are often a part of treatment for depression. Talk with your pharmacist or health care provider about all the medicines, supplements, and herbal products that you take, their possible side effects, and what medicines and other products are safe to take together. Make sure to report any side effects you may have to your health care provider. Relationships Your health care provider may suggest family therapy, couples therapy, or individual therapy as part of your treatment. How to recognize changes Everyone responds differently to treatment for depression. As you recover from depression, you may start to: Have more interest in doing activities. Feel less hopeless. Have more energy. Overeat less often, or have a better appetite. Have better mental focus. It is important to recognize if your depression is not getting better or is getting worse. The symptoms you had in the beginning may return, such as: Tiredness (fatigue) or low energy. Eating too much or too little. Sleeping too much or too little. Feeling restless, agitated, or  hopeless. Trouble focusing or making decisions. Unexplained physical complaints. Feeling irritable, angry, or aggressive. If you or your family members notice these symptoms coming back, let your health care provider know right away. Follow these instructions at home: Activity  Try to get some form of exercise each day, such as walking, biking, swimming, or lifting weights. Practice stress reduction techniques. Engage your mind by taking a class or doing some volunteer work. Lifestyle Get the right amount and quality of sleep. Cut down on using caffeine, tobacco, alcohol, and other potentially harmful substances. Eat a healthy diet that includes plenty of vegetables, fruits, whole grains, low-fat dairy products, and lean protein. Do not eat a lot of foods that are high in solid fats, added sugars, or salt (sodium). General instructions Take over-the-counter and prescription medicines only as told by your health care provider. Keep all follow-up visits as told by your health care provider. This is important. Where to find support Talking to others  Friends and family members can be sources of support and guidance. Talk to trusted friends or family members about your condition. Explain your symptoms to them, and let them know that you are working with a health care provider to treat your depression. Tell friends and family members how they also can be helpful. Finances Find appropriate mental health providers that fit with your financial situation. Talk with your health care provider about options to get reduced prices on your medicines. Where to find more information You can find support in your area from: Anxiety and Depression Association of America (ADAA): www.adaa.org Mental Health America: www.mentalhealthamerica.net National Alliance on Mental Illness: www.nami.org Contact a health care provider if: You stop taking your antidepressant medicines, and you have any of these  symptoms: Nausea. Headache. Light-headedness. Chills and body aches. Not being able to sleep (insomnia). You or your friends and family think your depression is getting worse. Get help right away if: You have thoughts of hurting yourself or others. If you ever feel like you may hurt yourself or others, or have thoughts about taking your own life, get help right away. Go to your nearest emergency department or: Call your local emergency services (911 in the [place] Call a suicide crisis helpline, such as the National Suicide Prevention Lifeline at   1-800-273-8255 or 988 in the [place] This is open 24 hours a day in the [place] Text the Crisis Text Line at 741741 (in the [place] Summary If you are diagnosed with depression, preparing yourself to manage your symptoms is a good way to feel positive about your future. Work with your health care provider on a management plan that includes stress reduction techniques, medicines (if applicable), therapy, and healthy lifestyle habits. Keep talking with your health care provider about how your treatment is working. If you have thoughts about taking your own life, call a suicide crisis helpline or text a crisis text line. This information is not intended to replace advice given to you by your health care provider. Make sure you discuss any questions you have with your health care provider. Document Revised: 08/05/2020 Document Reviewed: 11/21/2018 Elsevier Patient Education  2023 Elsevier Inc. Managing Stress, Adult Feeling a certain amount of stress is normal. Stress helps our body and mind get ready to deal with the demands of life. Stress hormones can motivate you to do well at work and meet your responsibilities. But severe or long-term (chronic) stress can affect your mental and physical health. Chronic stress puts you at higher risk for: Anxiety and depression. Other health problems such as digestive problems, muscle aches, heart disease, high blood  pressure, and stroke. What are the causes? Common causes of stress include: Demands from work, such as deadlines, feeling overworked, or having long hours. Pressures at home, such as money issues, disagreements with a spouse, or parenting issues. Pressures from major life changes, such as divorce, moving, loss of a loved one, or chronic illness. You may be at higher risk for stress-related problems if you: Do not get enough sleep. Are in poor health. Do not have emotional support. Have a mental health disorder such as anxiety or depression. How to recognize stress Stress can make you: Have trouble sleeping. Feel sad, anxious, irritable, or overwhelmed. Lose your appetite. Overeat or want to eat unhealthy foods. Want to use drugs or alcohol. Stress can also cause physical symptoms, such as: Sore, tense muscles, especially in the shoulders and neck. Headaches. Trouble breathing. A faster heart rate. Stomach pain, nausea, or vomiting. Diarrhea or constipation. Trouble concentrating. Follow these instructions at home: Eating and drinking Eat a healthy diet. This includes: Eating foods that are high in fiber, such as beans, whole grains, and fresh fruits and vegetables. Limiting foods that are high in fat and processed sugars, such as fried or sweet foods. Do not skip meals or overeat. Drink enough fluid to keep your urine pale yellow. Alcohol use Do not drink alcohol if: Your health care provider tells you not to drink. You are pregnant, may be pregnant, or are planning to become pregnant. Drinking alcohol is a way some people try to ease their stress. This can be dangerous, so if you drink alcohol: Limit how much you have to: 0-1 drink a day for women. 0-2 drinks a day for men. Know how much alcohol is in your drink. In the [place] one drink equals one 12 oz bottle of beer (355 mL), one 5 oz glass of wine (148 mL), or one 1 oz glass of hard liquor (44 mL). Activity  Include 30  minutes of exercise in your daily schedule. Exercise is a good stress reducer. Include time in your day for an activity that you find relaxing. Try taking a walk, going on a bike ride, reading a book, or listening to music. Schedule your time   in a way that lowers stress, and keep a regular schedule. Focus on doing what is most important to get done. Lifestyle Identify the source of your stress and your reaction to it. See a therapist who can help you change unhelpful reactions. When there are stressful events: Talk about them with family, friends, or coworkers. Try to think realistically about stressful events and not ignore them or overreact. Try to find the positives in a stressful situation and not focus on the negatives. Cut back on responsibilities at work and home, if possible. Ask for help from friends or family members if you need it. Find ways to manage stress, such as: Mindfulness, meditation, or deep breathing. Yoga or tai chi. Progressive muscle relaxation. Spending time in nature. Doing art, playing music, or reading. Making time for fun activities. Spending time with family and friends. Get support from family, friends, or spiritual resources. General instructions Get enough sleep. Try to go to sleep and get up at about the same time every day. Take over-the-counter and prescription medicines only as told by your health care provider. Do not use any products that contain nicotine or tobacco. These products include cigarettes, chewing tobacco, and vaping devices, such as e-cigarettes. If you need help quitting, ask your health care provider. Do not use drugs or smoke to deal with stress. Keep all follow-up visits. This is important. Where to find support Talk with your health care provider about stress management or finding a support group. Find a therapist to work with you on your stress management techniques. Where to find more information National Alliance on Mental  Illness: www.nami.org American Psychological Association: www.apa.org Contact a health care provider if: Your stress symptoms get worse. You are unable to manage your stress at home. You are struggling to stop using drugs or alcohol. Get help right away if: You may be a danger to yourself or others. You have any thoughts of death or suicide. Get help right awayif you feel like you may hurt yourself or others, or have thoughts about taking your own life. Go to your nearest emergency room or: Call 911. Call the National Suicide Prevention Lifeline at 1-800-273-8255 or 988 in the [place] This is open 24 hours a day. Text the Crisis Text Line at 741741. Summary Feeling a certain amount of stress is normal, but severe or long-term (chronic) stress can affect your mental and physical health. Chronic stress can put you at higher risk for anxiety, depression, and other health problems such as digestive problems, muscle aches, heart disease, high blood pressure, and stroke. You may be at higher risk for stress-related problems if you do not get enough sleep, are in poor health, lack emotional support, or have a mental health disorder such as anxiety or depression. Identify the source of your stress and your reaction to it. Try talking about stressful events with family, friends, or coworkers, finding a coping method, or getting support from spiritual resources. If you need more help, talk with your health care provider about finding a support group or a mental health therapist. This information is not intended to replace advice given to you by your health care provider. Make sure you discuss any questions you have with your health care provider. Document Revised: 08/06/2020 Document Reviewed: 08/04/2020 Elsevier Patient Education  2023 Elsevier Inc.  

## 2022-01-05 LAB — MONITOR DRUG PROFILE 14(MW)
Amphetamine Scrn, Ur: NEGATIVE ng/mL
BARBITURATE SCREEN URINE: NEGATIVE ng/mL
BENZODIAZEPINE SCREEN, URINE: NEGATIVE ng/mL
Buprenorphine, Urine: NEGATIVE ng/mL
CANNABINOIDS UR QL SCN: NEGATIVE ng/mL
Cocaine (Metab) Scrn, Ur: NEGATIVE ng/mL
Creatinine(Crt), U: 86.2 mg/dL (ref 20.0–300.0)
Fentanyl, Urine: NEGATIVE pg/mL
Meperidine Screen, Urine: NEGATIVE ng/mL
Methadone Screen, Urine: NEGATIVE ng/mL
OXYCODONE+OXYMORPHONE UR QL SCN: NEGATIVE ng/mL
Opiate Scrn, Ur: NEGATIVE ng/mL
Ph of Urine: 7.4 (ref 4.5–8.9)
Phencyclidine Qn, Ur: NEGATIVE ng/mL
Propoxyphene Scrn, Ur: NEGATIVE ng/mL
SPECIFIC GRAVITY: 1.025
Tramadol Screen, Urine: NEGATIVE ng/mL

## 2022-01-06 LAB — URINE CYTOLOGY ANCILLARY ONLY
Chlamydia: NEGATIVE
Comment: NEGATIVE
Comment: NORMAL
Neisseria Gonorrhea: NEGATIVE

## 2022-01-19 DIAGNOSIS — J069 Acute upper respiratory infection, unspecified: Secondary | ICD-10-CM | POA: Diagnosis not present

## 2022-01-19 DIAGNOSIS — Z20822 Contact with and (suspected) exposure to covid-19: Secondary | ICD-10-CM | POA: Diagnosis not present

## 2022-01-24 NOTE — L&D Delivery Note (Signed)
Delivery Note   Jane Cordova is a 22 y.o. G1P0 at [redacted]w[redacted]d Estimated Date of Delivery: 06/16/22  PRE-OPERATIVE DIAGNOSIS:  1) [redacted]w[redacted]d pregnancy.   POST-OPERATIVE DIAGNOSIS:  1) [redacted]w[redacted]d pregnancy s/p Vaginal, Spontaneous   Delivery Type: Vaginal, Spontaneous    Delivery Anesthesia: Epidural   Labor Complications: none    ESTIMATED BLOOD LOSS: 50 ml    FINDINGS:   1) female infant, "Waylon," Apgar scores of 8   at 1 minute and 9   at 5 minutes; birthweight pending.   SPECIMENS:   PLACENTA:   Appearance: Intact    Removal: Spontaneous      Disposition: per protocol  CORD BLOOD: Not Indicated  DISPOSITION:  Infant left in stable condition in the delivery room, with L&D personnel and mother.  NARRATIVE SUMMARY: Labor course:  Jane Cordova is a G1P0 at [redacted]w[redacted]d who presented to Labor & Delivery for induction of labor. Her initial cervical exam was closed/thick. Labor proceeded with augmentation and she was found to be completely dilated at 0553. With excellent maternal pushing effort, she birthed a viable female infant at 49. There was not a nuchal cord. The shoulders were birthed without difficulty. The infant was placed skin-to-skin with Jane Cordova. The cord was doubly clamped and cut when pulsations ceased. The placenta delivered spontaneously and was noted to be intact with a 3VC. A perineal and vaginal examination was performed. Episiotomy/Lacerations: 1st degree;Vaginal  Lacerations were repaired with Vicryl suture using epidural anesthesia. The patient tolerated this well. Mother and baby were left in stable condition.   Jane Cordova, CNM 06/24/2022 10:22 AM

## 2022-01-26 ENCOUNTER — Ambulatory Visit
Admission: RE | Admit: 2022-01-26 | Discharge: 2022-01-26 | Disposition: A | Discharge status: Home / Self Care | Payer: Medicaid Other | Source: Ambulatory Visit | Attending: Advanced Practice Midwife | Admitting: Advanced Practice Midwife

## 2022-01-26 ENCOUNTER — Other Ambulatory Visit: Payer: BC Managed Care – PPO

## 2022-01-26 DIAGNOSIS — Z369 Encounter for antenatal screening, unspecified: Secondary | ICD-10-CM | POA: Insufficient documentation

## 2022-01-26 DIAGNOSIS — Z3689 Encounter for other specified antenatal screening: Secondary | ICD-10-CM | POA: Insufficient documentation

## 2022-01-26 DIAGNOSIS — Z3A20 20 weeks gestation of pregnancy: Secondary | ICD-10-CM | POA: Diagnosis not present

## 2022-01-26 DIAGNOSIS — Z3402 Encounter for supervision of normal first pregnancy, second trimester: Secondary | ICD-10-CM | POA: Diagnosis present

## 2022-01-27 ENCOUNTER — Ambulatory Visit (INDEPENDENT_AMBULATORY_CARE_PROVIDER_SITE_OTHER): Payer: BC Managed Care – PPO | Admitting: Obstetrics & Gynecology

## 2022-01-27 ENCOUNTER — Encounter: Payer: Self-pay | Admitting: Obstetrics & Gynecology

## 2022-01-27 ENCOUNTER — Other Ambulatory Visit (HOSPITAL_COMMUNITY)
Admission: RE | Admit: 2022-01-27 | Discharge: 2022-01-27 | Disposition: A | Discharge status: Home / Self Care | Payer: BC Managed Care – PPO | Source: Ambulatory Visit | Attending: Obstetrics & Gynecology | Admitting: Obstetrics & Gynecology

## 2022-01-27 VITALS — BP 117/69 | HR 90 | Ht 66.0 in | Wt 154.0 lb

## 2022-01-27 DIAGNOSIS — B3731 Acute candidiasis of vulva and vagina: Secondary | ICD-10-CM | POA: Insufficient documentation

## 2022-01-27 DIAGNOSIS — Z362 Encounter for other antenatal screening follow-up: Secondary | ICD-10-CM

## 2022-01-27 DIAGNOSIS — O09292 Supervision of pregnancy with other poor reproductive or obstetric history, second trimester: Secondary | ICD-10-CM

## 2022-01-27 DIAGNOSIS — R300 Dysuria: Secondary | ICD-10-CM

## 2022-01-27 DIAGNOSIS — N898 Other specified noninflammatory disorders of vagina: Secondary | ICD-10-CM | POA: Diagnosis not present

## 2022-01-27 DIAGNOSIS — Z3A20 20 weeks gestation of pregnancy: Secondary | ICD-10-CM

## 2022-01-27 LAB — POCT URINALYSIS DIPSTICK OB
Bilirubin, UA: NEGATIVE
Blood, UA: NEGATIVE
Glucose, UA: NEGATIVE
Ketones, UA: NEGATIVE
Leukocytes, UA: NEGATIVE
Nitrite, UA: NEGATIVE
POC,PROTEIN,UA: NEGATIVE
Spec Grav, UA: 1.01 (ref 1.010–1.025)
Urobilinogen, UA: 0.2 E.U./dL — AB
pH, UA: 7 (ref 5.0–8.0)

## 2022-01-27 MED ORDER — FLUCONAZOLE 150 MG PO TABS: 150.0000 mg | ORAL_TABLET | Freq: Once | ORAL | 0 refills | Status: AC

## 2022-01-27 NOTE — Progress Notes (Signed)
   Established Patient Office Visit  Subjective   Patient ID: ERICIA MOXLEY, female    DOB: 06-10-2000  Age: 22 y.o. MRN: [id number]  Chief Complaint  Patient presents with   Vaginal Discharge    HPI   22 yo G1 at 20 weeks here for a work in visit with a vaginal discharge that is very itchy for the last month. She tried Monistat 7 with no help.  She feels good FM and denies vaginal bleeding, ROM, and contractions.  Objective:     BP 117/69   Pulse 90   Ht 5\' 6"  (1.676 m)   Wt 154 lb (69.9 kg)   LMP 09/10/2021 (Approximate)   BMI 24.86 kg/m    Physical Exam   Well nourished, well hydrated White female, no apparent distress She is ambulating and conversing normally. Abd- benign, gravid FH- at umbilicus [id number] Spec exam reveals a very large amount of curdy white discharge c/w yeast  Assessment & Plan:    [redacted] weeks pregnant- Routine care Vaginal yeast with no relief with Monistat 7- diflucan prescribed  She will come back for ROB in 4 weeks for ROB and follow up ultrasound to complete anatomy scan. Problem List Items Addressed This Visit   None   No follow-ups on file.    Emily Filbert, MD

## 2022-01-31 LAB — CERVICOVAGINAL ANCILLARY ONLY
Bacterial Vaginitis (gardnerella): NEGATIVE
Candida Glabrata: NEGATIVE
Candida Vaginitis: POSITIVE — AB
Comment: NEGATIVE
Comment: NEGATIVE
Comment: NEGATIVE
Comment: NEGATIVE
Trichomonas: NEGATIVE

## 2022-02-01 ENCOUNTER — Encounter: Payer: Medicaid Other | Admitting: Advanced Practice Midwife

## 2022-02-03 ENCOUNTER — Ambulatory Visit
Admission: RE | Admit: 2022-02-03 | Discharge: 2022-02-03 | Disposition: A | Discharge status: Home / Self Care | Payer: BC Managed Care – PPO | Source: Ambulatory Visit | Attending: Obstetrics & Gynecology | Admitting: Obstetrics & Gynecology

## 2022-02-03 DIAGNOSIS — Z362 Encounter for other antenatal screening follow-up: Secondary | ICD-10-CM | POA: Insufficient documentation

## 2022-02-03 DIAGNOSIS — Z3689 Encounter for other specified antenatal screening: Secondary | ICD-10-CM | POA: Diagnosis not present

## 2022-02-03 DIAGNOSIS — Z3A20 20 weeks gestation of pregnancy: Secondary | ICD-10-CM | POA: Diagnosis not present

## 2022-02-24 ENCOUNTER — Ambulatory Visit (INDEPENDENT_AMBULATORY_CARE_PROVIDER_SITE_OTHER): Payer: Medicaid Other | Admitting: Advanced Practice Midwife

## 2022-02-24 VITALS — BP 120/60 | Wt 161.0 lb

## 2022-02-24 DIAGNOSIS — Z113 Encounter for screening for infections with a predominantly sexual mode of transmission: Secondary | ICD-10-CM

## 2022-02-24 DIAGNOSIS — Z131 Encounter for screening for diabetes mellitus: Secondary | ICD-10-CM

## 2022-02-24 DIAGNOSIS — Z369 Encounter for antenatal screening, unspecified: Secondary | ICD-10-CM

## 2022-02-24 DIAGNOSIS — Z130 Encounter for screening for diseases of the blood and blood-forming organs and certain disorders involving the immune mechanism: Secondary | ICD-10-CM

## 2022-02-24 DIAGNOSIS — Z3402 Encounter for supervision of normal first pregnancy, second trimester: Secondary | ICD-10-CM

## 2022-02-24 DIAGNOSIS — Z3A24 24 weeks gestation of pregnancy: Secondary | ICD-10-CM

## 2022-02-24 LAB — POCT URINALYSIS DIPSTICK OB
Bilirubin, UA: NEGATIVE
Blood, UA: NEGATIVE
Glucose, UA: NEGATIVE
Ketones, UA: NEGATIVE
Leukocytes, UA: NEGATIVE
Nitrite, UA: NEGATIVE
POC,PROTEIN,UA: NEGATIVE
Spec Grav, UA: 1.01 (ref 1.010–1.025)
Urobilinogen, UA: 1 E.U./dL
pH, UA: 6 (ref 5.0–8.0)

## 2022-02-24 NOTE — Progress Notes (Signed)
Routine Prenatal Care Visit  Subjective  Jane Cordova is a 23 y.o. G1P0 at [redacted]w[redacted]d being seen today for ongoing prenatal care.  She is currently monitored for the following issues for this low-risk pregnancy and has Supervision of normal pregnancy; History of arthroscopy of knee; and Pain in joint of left knee on their problem list.  ----------------------------------------------------------------------------------- Patient reports no complaints.  Reviewed her recent f/u u/s -still incomplete for spine. Contractions: Not present. Vag. Bleeding: None.  Movement: Present. Leaking Fluid denies.  ----------------------------------------------------------------------------------- The following portions of the patient's history were reviewed and updated as appropriate: allergies, current medications, past family history, past medical history, past social history, past surgical history and problem list. Problem list updated.  Objective  Blood pressure 120/60, weight 161 lb (73 kg), last menstrual period 09/10/2021. Pregravid weight 143 lb (64.9 kg) Total Weight Gain 18 lb (8.165 kg) Urinalysis: Urine Protein Negative  Urine Glucose Negative  Fetal Status: Fetal Heart Rate (bpm): 146 Fundal Height: 24 cm Movement: Present     General:  Alert, oriented and cooperative. Patient is in no acute distress.  Skin: Skin is warm and dry. No rash noted.   Cardiovascular: Normal heart rate noted  Respiratory: Normal respiratory effort, no problems with respiration noted  Abdomen: Soft, gravid, appropriate for gestational age. Pain/Pressure: Absent     Pelvic:  Cervical exam deferred        Extremities: Normal range of motion.  Edema: None  Mental Status: Normal mood and affect. Normal behavior. Normal judgment and thought content.   Assessment   22 y.o. G1P0 at [redacted]w[redacted]d by  06/16/2022, by Ultrasound presenting for routine prenatal visit  Plan   FIRST Problems (from 12/24/21 to present)     Problem Noted  Resolved   Supervision of normal pregnancy 12/23/2021 by Drenda Freeze, CMA No   Overview Addendum 02/24/2022  1:31 PM by Quintella Baton, [place] Staff Provider  Office Location  [facility] Dating  By 8w u/s  Language  English Anatomy US  Incomplete for spine  Flu Vaccine  Offer Genetic Screen  NIPS:   TDaP vaccine   Offer Hgb A1C or  GTT Early : NA Third trimester :   Covid Declines   LAB RESULTS   Rhogam     Blood Type   A Positive  Feeding Plan Breastfeed Antibody  negative  Contraception Declines Rubella  Immune 2.51  Circumcision Yes RPR   Non reactive   Pediatrician  Undecided HBsAg   Negative  Support Person Geophysical data processor and Mom HIV  Non Reactive  Prenatal Classes Yes Varicella Negative    GBS  (For PCN allergy, check sensitivities)   BTL Consent  Hep C   negative  VBAC Consent NA Pap No results found for: "DIAGPAP"    Hgb Electro      CF      SMA                   Preterm labor symptoms and general obstetric precautions including but not limited to vaginal bleeding, contractions, leaking of fluid and fetal movement were reviewed in detail with the patient. Please refer to After Visit Summary for other counseling recommendations.   Return in about 4 weeks (around 03/24/2022) for f/u anatomy in 2 weeks and 28 wk labs and rob in 4 weeks.  Rod Can, CNM 02/24/2022 1:45 PM

## 2022-03-10 ENCOUNTER — Ambulatory Visit: Payer: BC Managed Care – PPO

## 2022-03-15 ENCOUNTER — Ambulatory Visit
Admission: RE | Admit: 2022-03-15 | Discharge: 2022-03-15 | Disposition: A | Discharge status: Home / Self Care | Payer: BC Managed Care – PPO | Source: Ambulatory Visit | Attending: Advanced Practice Midwife | Admitting: Advanced Practice Midwife

## 2022-03-15 DIAGNOSIS — Z369 Encounter for antenatal screening, unspecified: Secondary | ICD-10-CM | POA: Diagnosis not present

## 2022-03-15 DIAGNOSIS — Z3402 Encounter for supervision of normal first pregnancy, second trimester: Secondary | ICD-10-CM | POA: Insufficient documentation

## 2022-03-15 DIAGNOSIS — Z3492 Encounter for supervision of normal pregnancy, unspecified, second trimester: Secondary | ICD-10-CM | POA: Diagnosis not present

## 2022-03-15 DIAGNOSIS — Z3689 Encounter for other specified antenatal screening: Secondary | ICD-10-CM | POA: Insufficient documentation

## 2022-03-15 DIAGNOSIS — Z3A27 27 weeks gestation of pregnancy: Secondary | ICD-10-CM | POA: Diagnosis not present

## 2022-03-24 ENCOUNTER — Ambulatory Visit (INDEPENDENT_AMBULATORY_CARE_PROVIDER_SITE_OTHER): Payer: Medicaid Other | Admitting: Licensed Practical Nurse

## 2022-03-24 ENCOUNTER — Encounter: Payer: Medicaid Other | Admitting: Advanced Practice Midwife

## 2022-03-24 ENCOUNTER — Encounter: Payer: Self-pay | Admitting: Licensed Practical Nurse

## 2022-03-24 ENCOUNTER — Other Ambulatory Visit: Payer: Medicaid Other

## 2022-03-24 VITALS — BP 114/66 | HR 96 | Wt 171.8 lb

## 2022-03-24 DIAGNOSIS — Z3402 Encounter for supervision of normal first pregnancy, second trimester: Secondary | ICD-10-CM

## 2022-03-24 DIAGNOSIS — Z23 Encounter for immunization: Secondary | ICD-10-CM

## 2022-03-24 DIAGNOSIS — Z3A28 28 weeks gestation of pregnancy: Secondary | ICD-10-CM

## 2022-03-24 DIAGNOSIS — Z113 Encounter for screening for infections with a predominantly sexual mode of transmission: Secondary | ICD-10-CM

## 2022-03-24 DIAGNOSIS — Z369 Encounter for antenatal screening, unspecified: Secondary | ICD-10-CM

## 2022-03-24 DIAGNOSIS — Z130 Encounter for screening for diseases of the blood and blood-forming organs and certain disorders involving the immune mechanism: Secondary | ICD-10-CM

## 2022-03-24 DIAGNOSIS — Z131 Encounter for screening for diabetes mellitus: Secondary | ICD-10-CM

## 2022-03-24 LAB — POCT URINALYSIS DIPSTICK
Bilirubin, UA: NEGATIVE
Blood, UA: NEGATIVE
Glucose, UA: NEGATIVE
Ketones, UA: NEGATIVE
Leukocytes, UA: NEGATIVE
Nitrite, UA: NEGATIVE
Protein, UA: NEGATIVE
Spec Grav, UA: 1.02 (ref 1.010–1.025)
Urobilinogen, UA: 1 E.U./dL
pH, UA: 6.5 (ref 5.0–8.0)

## 2022-03-24 NOTE — Progress Notes (Signed)
Routine Prenatal Care Visit  Subjective  Jane Cordova is a 22 y.o. G1P0 at 27w0dbeing seen today for ongoing prenatal care.  She is currently monitored for the following issues for this low-risk pregnancy and has Supervision of normal pregnancy; History of arthroscopy of knee; and Pain in joint of left knee on their problem list.  ----------------------------------------------------------------------------------- Patient reports no bleeding, no contractions, no cramping, and no leaking.   Contractions: Not present. Vag. Bleeding: None.  Movement: Present. Leaking Fluid denies.  ----------------------------------------------------------------------------------- The following portions of the patient's history were reviewed and updated as appropriate: allergies, current medications, past family history, past medical history, past social history, past surgical history and problem list. Problem list updated.  Objective  Blood pressure 114/66, pulse 96, weight 171 lb 12.8 oz (77.9 kg), last menstrual period 09/10/2021. Pregravid weight 143 lb (64.9 kg) Total Weight Gain 28 lb 12.8 oz (13.1 kg) Urinalysis: Urine Protein    Urine Glucose    Fetal Status: Fetal Heart Rate (bpm): 141 Fundal Height: 27 cm Movement: Present     General:  Alert, oriented and cooperative. Patient is in no acute distress.  Skin: Skin is warm and dry. No rash noted.   Cardiovascular: Normal heart rate noted  Respiratory: Normal respiratory effort, no problems with respiration noted  Abdomen: Soft, gravid, appropriate for gestational age. Pain/Pressure: Absent     Pelvic:  Cervical exam deferred        Extremities: Normal range of motion.     Mental Status: Normal mood and affect. Normal behavior. Normal judgment and thought content.   Assessment   22y.o. G1P0 at 260w0dy  06/16/2022, by Ultrasound presenting for routine prenatal visit  Plan   FIRST Problems (from 12/24/21 to present)     Problem Noted Resolved    Supervision of normal pregnancy 12/23/2021 by ArDrenda FreezeCM[place]angevilleo   Overview Addendum 03/24/2022  2:49 PM by SoInis SizerCMDe Queentaff Provider  Office Location  [facility] Dating  Not found.  Language  English Anatomy [place]  Flu Vaccine  Offer Genetic Screen  NIPS:   TDaP vaccine   03/24/22 Hgb A1C or  GTT Early : Third trimester :   Covid Declines   LAB RESULTS   Rhogam     Blood Type   A Positive  Feeding Plan Breastfeed Antibody  negative  Contraception Declines Rubella  Immune 2.51  Circumcision Yes RPR   Non reactive   Pediatrician  Undecided HBsAg   Negative  Support Person TyGeophysical data processornd Mom HIV  Non Reactive  Prenatal Classes Yes Varicella Negative    GBS  (For PCN allergy, check sensitivities)   BTL Consent  Hep C   negative  VBAC Consent  Pap No results found for: "DIAGPAP"    Hgb Electro      CF      SMA                   Preterm labor symptoms and general obstetric precautions including but not limited to vaginal bleeding, contractions, leaking of fluid and fetal movement were reviewed in detail with the patient.  Patient completed glucola test today; tolerated well. Discussed child birth classes and how to access them. Patient plans on getting an epidural and breastfeeding her baby. No current plans for PP contraception. Patient stated that she experiences pain below her right shoulder blade after eating. Discussed that simethicone or antiacids could help with  the discomfort.   Please refer to After Visit Summary for other counseling recommendations.   Return in about 2 weeks (around 04/07/2022) for [place].  Pt seen by CNM and [place]  03/24/22  5:10 PM

## 2022-03-25 LAB — 28 WEEK RH+PANEL
Basophils Absolute: 0 10*3/uL (ref 0.0–0.2)
Basos: 0 %
EOS (ABSOLUTE): 0.1 10*3/uL (ref 0.0–0.4)
Eos: 1 %
Gestational Diabetes Screen: 96 mg/dL (ref 70–139)
HIV Screen 4th Generation wRfx: NONREACTIVE
Hematocrit: 35.9 % (ref 34.0–46.6)
Hemoglobin: 12 g/dL (ref 11.1–15.9)
Immature Grans (Abs): 0.1 10*3/uL (ref 0.0–0.1)
Immature Granulocytes: 1 %
Lymphocytes Absolute: 1.6 10*3/uL (ref 0.7–3.1)
Lymphs: 14 %
MCH: 29.3 pg (ref 26.6–33.0)
MCHC: 33.4 g/dL (ref 31.5–35.7)
MCV: 88 fL (ref 79–97)
Monocytes Absolute: 0.6 10*3/uL (ref 0.1–0.9)
Monocytes: 5 %
Neutrophils Absolute: 8.6 10*3/uL — ABNORMAL HIGH (ref 1.4–7.0)
Neutrophils: 79 %
Platelets: 240 10*3/uL (ref 150–450)
RBC: 4.1 x10E6/uL (ref 3.77–5.28)
RDW: 11.9 % (ref 11.7–15.4)
RPR Ser Ql: NONREACTIVE
WBC: 11 10*3/uL — ABNORMAL HIGH (ref 3.4–10.8)

## 2022-04-04 ENCOUNTER — Other Ambulatory Visit: Payer: Self-pay | Admitting: Family Medicine

## 2022-04-04 DIAGNOSIS — R1011 Right upper quadrant pain: Secondary | ICD-10-CM

## 2022-04-04 DIAGNOSIS — D729 Disorder of white blood cells, unspecified: Secondary | ICD-10-CM | POA: Diagnosis not present

## 2022-04-04 DIAGNOSIS — M549 Dorsalgia, unspecified: Secondary | ICD-10-CM | POA: Diagnosis not present

## 2022-04-07 ENCOUNTER — Ambulatory Visit (INDEPENDENT_AMBULATORY_CARE_PROVIDER_SITE_OTHER): Payer: BC Managed Care – PPO | Admitting: Certified Nurse Midwife

## 2022-04-07 ENCOUNTER — Encounter: Payer: Self-pay | Admitting: Certified Nurse Midwife

## 2022-04-07 ENCOUNTER — Ambulatory Visit (INDEPENDENT_AMBULATORY_CARE_PROVIDER_SITE_OTHER): Payer: BC Managed Care – PPO

## 2022-04-07 VITALS — BP 105/68 | HR 94 | Wt 176.2 lb

## 2022-04-07 VITALS — BP 105/68 | HR 94 | Ht 67.0 in | Wt 176.0 lb

## 2022-04-07 DIAGNOSIS — Z3A30 30 weeks gestation of pregnancy: Secondary | ICD-10-CM

## 2022-04-07 DIAGNOSIS — O368130 Decreased fetal movements, third trimester, not applicable or unspecified: Secondary | ICD-10-CM | POA: Insufficient documentation

## 2022-04-07 DIAGNOSIS — Z3483 Encounter for supervision of other normal pregnancy, third trimester: Secondary | ICD-10-CM

## 2022-04-07 DIAGNOSIS — Z3480 Encounter for supervision of other normal pregnancy, unspecified trimester: Secondary | ICD-10-CM

## 2022-04-07 LAB — POCT URINALYSIS DIPSTICK OB
Bilirubin, UA: NEGATIVE
Blood, UA: NEGATIVE
Glucose, UA: NEGATIVE
Ketones, UA: NEGATIVE
Leukocytes, UA: NEGATIVE
Nitrite, UA: NEGATIVE
POC,PROTEIN,UA: NEGATIVE
Spec Grav, UA: 1.01 (ref 1.010–1.025)
Urobilinogen, UA: 0.2 E.U./dL
pH, UA: 7 (ref 5.0–8.0)

## 2022-04-07 NOTE — Patient Instructions (Signed)

## 2022-04-07 NOTE — Progress Notes (Addendum)
    NURSE VISIT NOTE  Subjective:    Patient ID: Jane Cordova, female    DOB: [birth year], 22 y.o.   MRN: [id number]  HPI  Patient is a 22 y.o. G1P0 female who presents for fetal monitoring per order from Philip Aspen, [place].   Objective:    BP 105/68   Pulse 94   Ht 5\' 7"  (1.702 m)   Wt 176 lb (79.8 kg)   LMP 09/10/2021 (Approximate)   BMI 27.57 kg/m  Estimated Date of Delivery: 06/16/22  [redacted]w[redacted]d  Fetus A Non-Stress Test Interpretation for 04/07/22  Indication: Decreased Fetal Movement  Fetal Heart Rate A Mode: External Baseline Rate (A): 145 bpm Variability: Moderate Accelerations: 15 x 15 Decelerations: Variable Multiple birth?: No  Uterine Activity Mode: None  Interpretation (Fetal Testing) Nonstress Test Interpretation: Reactive Overall Impression: Reassuring for gestational age   Assessment:   1. Decreased fetal movements in third trimester, single or unspecified fetus      Plan:   Results reviewed and discussed with patient by  Philip Aspen, CNM.     Otila Kluver, LPN

## 2022-04-07 NOTE — Progress Notes (Signed)
ROB doing well, state the baby has not moved as much the last few days. Discussed that as baby increases in size the movement is more of a rolling rather than big kick due to less room . She states she is not feeling rolling or kick movements as much.   Pt placed on NST machine. NST reactive . See nurse  documentation    Jane Cordova, CNM

## 2022-04-07 NOTE — Patient Instructions (Signed)
[place] [facility]ian List  [facility]  [address], [place], [place] [address]  Phone: [phone]  [facility] (second location)  [address] [place], [place] [address]  Phone: [phone]  [facility] ([place]) [address], [place], [place] [address] Phone: [phone]  [facility]  [address] [place], [place] [address]  Phone: [phone] 

## 2022-04-11 ENCOUNTER — Encounter: Payer: Self-pay | Admitting: Certified Nurse Midwife

## 2022-04-13 ENCOUNTER — Ambulatory Visit
Admission: RE | Admit: 2022-04-13 | Discharge: 2022-04-13 | Disposition: A | Discharge status: Home / Self Care | Payer: BC Managed Care – PPO | Source: Ambulatory Visit | Attending: Family Medicine | Admitting: Family Medicine

## 2022-04-13 DIAGNOSIS — N1330 Unspecified hydronephrosis: Secondary | ICD-10-CM | POA: Diagnosis not present

## 2022-04-13 DIAGNOSIS — R1011 Right upper quadrant pain: Secondary | ICD-10-CM | POA: Diagnosis not present

## 2022-04-13 DIAGNOSIS — R109 Unspecified abdominal pain: Secondary | ICD-10-CM | POA: Diagnosis not present

## 2022-04-20 ENCOUNTER — Ambulatory Visit (INDEPENDENT_AMBULATORY_CARE_PROVIDER_SITE_OTHER): Payer: BC Managed Care – PPO | Admitting: Certified Nurse Midwife

## 2022-04-20 VITALS — BP 116/71 | HR 85 | Wt 179.5 lb

## 2022-04-20 DIAGNOSIS — Z3403 Encounter for supervision of normal first pregnancy, third trimester: Secondary | ICD-10-CM

## 2022-04-20 DIAGNOSIS — Z3A31 31 weeks gestation of pregnancy: Secondary | ICD-10-CM

## 2022-04-20 LAB — POCT URINALYSIS DIPSTICK OB
Bilirubin, UA: NEGATIVE
Blood, UA: NEGATIVE
Glucose, UA: NEGATIVE
Ketones, UA: NEGATIVE
Leukocytes, UA: NEGATIVE
Nitrite, UA: NEGATIVE
POC,PROTEIN,UA: NEGATIVE
Spec Grav, UA: <=1.005 — AB (ref 1.010–1.025)
Urobilinogen, UA: 0.2 E.U./dL
pH, UA: 6.5 (ref 5.0–8.0)

## 2022-04-20 NOTE — Patient Instructions (Signed)
Preterm Labor Pregnancy normally lasts 39-41 weeks. Preterm labor is when labor starts before you have been pregnant for 37 weeks. Babies who are born too early may have a higher risk for long-term problems like cerebral palsy or developmental delays. They may also have problems soon after birth, such as problems with blood sugar, body temperature, heart, and breathing. These problems may be very serious in babies who are born before 34 weeks of pregnancy. What are the causes? The cause of this condition is not known. What increases the risk? You are more likely to have preterm labor if: You have medical problems, now or in the past. You have problems now or in your past pregnancies. You have lifestyle problems. Medical history You have problems of the womb (uterus). You have an infection, including infections you get from sex. You have problems that do not go away, such as: Blood clots. High blood pressure. High blood sugar. You have low body weight or too much body weight. Present and past pregnancies You have had preterm labor before. You are pregnant with two babies or more. You have a condition in which the placenta covers your cervix. You waited less than 18 months between giving birth and becoming pregnant again. Your unborn baby has some problems. You have bleeding from your vagina. You became pregnant by a method called IVF. Lifestyle You smoke. You drink alcohol. You use drugs. You have stress. You have abuse in your home. You come in contact with chemicals that harm the body (pollutants). Other factors You are younger than 17 years or older than 35 years. What are the signs or symptoms? Symptoms of this condition include: Cramps. The cramps may feel like cramps from a period. You may also have watery poop (diarrhea). Pain in the belly (abdomen). Pain in the lower back. Regular contractions. It may feel like your belly is getting tighter. Pressure in the lower  belly. More fluid leaking from the vagina. The fluid may be watery or bloody. Water breaking. How is this treated? Treatment for this condition depends on your health, the health of your baby, and how old your pregnancy is. It may include: Taking medicines, such as: Hormone medicines. Medicines to stop contractions. Medicines to help mature the baby's lungs. Medicines to prevent your baby from getting cerebral palsy or other problems. Bed rest. If the labor happens before 34 weeks of pregnancy, you may need to stay in the hospital. Delivering the baby. Follow these instructions at home:  Do not smoke or use any products that contain nicotine or tobacco. If you need help quitting, ask your doctor. Do not drink alcohol. Take over-the-counter and prescription medicines only as told by your doctor. Rest as told by your doctor. Return to your normal activities when your doctor says that it is safe. Keep all follow-up visits. How is this prevented? To have a healthy pregnancy: Do not use drugs. Do not use any medicines unless you ask your doctor if they are safe for you. Talk with your doctor before taking any herbal supplements. Make sure you gain enough weight. Watch for infection. If you think you might have an infection, get it checked right away. Symptoms of infection may include: Fever. Vaginal discharge that smells bad or is not normal. Pain or burning when you pee. Needing to pee urgently. Needing to pee often. Peeing small amounts often. Blood in your pee. Pee that smells bad or unusual. Where to find more information U.S. Department of Health and Human Services   Office on Women's Health: www.womenshealth.gov The American College of Obstetricians and Gynecologists: www.acog.org Centers for Disease Control and Prevention: www.cdc.gov Contact a doctor if: You think you are going into preterm labor. You have symptoms of preterm labor. You have symptoms of infection. Get help  right away if: You are having painful contractions every 5 minutes or less. Your water breaks. Summary Preterm labor is labor that starts before you reach 37 weeks of pregnancy. Your baby may have problems if delivered early. You are more likely to have preterm labor if you have certain medical problems or problems with a pregnancy now or in the past. Some lifestyle factors can also increase the risk. Contact a doctor if you have symptoms of preterm labor. This information is not intended to replace advice given to you by your health care provider. Make sure you discuss any questions you have with your health care provider. Document Revised: 01/14/2020 Document Reviewed: 01/14/2020 Elsevier Patient Education  2023 Elsevier Inc.  

## 2022-04-20 NOTE — Progress Notes (Signed)
ROB doing well, feeling good movement. Pt has been having some right sided back pain that she saw her PCP for. She had u/s that showed right hydronephrosis. Reviewed finding with pt. Reassurance given. She is experiencing some discomfort with it. Discussed self help measures. Discussed potential for need of strong pain medication should the pain increase. She would like to avoid and declines at this time. Urine dip negative.    Follow up 2 wk for ROB.

## 2022-05-05 ENCOUNTER — Encounter: Payer: BC Managed Care – PPO | Admitting: Obstetrics & Gynecology

## 2022-05-05 ENCOUNTER — Ambulatory Visit (INDEPENDENT_AMBULATORY_CARE_PROVIDER_SITE_OTHER): Payer: BC Managed Care – PPO | Admitting: Obstetrics & Gynecology

## 2022-05-05 VITALS — BP 125/75 | HR 101 | Wt 184.0 lb

## 2022-05-05 DIAGNOSIS — Z3403 Encounter for supervision of normal first pregnancy, third trimester: Secondary | ICD-10-CM | POA: Diagnosis not present

## 2022-05-05 DIAGNOSIS — Z3A34 34 weeks gestation of pregnancy: Secondary | ICD-10-CM

## 2022-05-05 NOTE — Progress Notes (Addendum)
   PRENATAL VISIT NOTE  Subjective:  Jane Cordova is a 22 y.o. G1P0 at [redacted]w[redacted]d being seen today for ongoing prenatal care.  She is currently monitored for the following issues for this low-risk pregnancy and has Supervision of normal pregnancy; History of arthroscopy of knee; Pain in joint of left knee; and Decreased fetal movements in third trimester on their problem list.  Patient reports no complaints.  Contractions: Irritability. Vag. Bleeding: None.  Movement: Present. Denies leaking of fluid.   The following portions of the patient's history were reviewed and updated as appropriate: allergies, current medications, past family history, past medical history, past social history, past surgical history and problem list.   Objective:   Vitals:   05/05/22 1051  BP: 125/75  Pulse: (!) 101  Weight: 184 lb (83.5 kg)   Jane Cordova [birth year] [redacted]w[redacted]d  Fetus A Non-Stress Test Interpretation for 05/05/22  Indication: fetal tachycardia with Doppler  Fetal Heart Rate A Mode: External Variability: Moderate Accelerations: 15 x 15 Decelerations: None  Uterine Activity Mode: None  Interpretation (Fetal Testing) Nonstress Test Interpretation: Reactive Overall Impression: Reassuring for gestational age   Fetal Status:     Movement: Present     General:  Alert, oriented and cooperative. Patient is in no acute distress.  Skin: Skin is warm and dry. No rash noted.   Cardiovascular: Normal heart rate noted  Respiratory: Normal respiratory effort, no problems with respiration noted  Abdomen: Soft, gravid, appropriate for gestational age.  Pain/Pressure: Present     Pelvic: Cervical exam deferred        Extremities: Normal range of motion.     Mental Status: Normal mood and affect. Normal behavior. Normal judgment and thought content.  FHR- 180s with doppler so NST was done. Baseline 150s, reactive  Assessment and Plan:  Pregnancy: G1P0 at [redacted]w[redacted]d 1. Encounter for supervision of  normal first pregnancy in third trimester  - POC Urinalysis Dipstick OB  2. [redacted] weeks gestation of pregnancy    Preterm labor symptoms and general obstetric precautions including but not limited to vaginal bleeding, contractions, leaking of fluid and fetal movement were reviewed in detail with the patient. Please refer to After Visit Summary for other counseling recommendations.   No follow-ups on file.  No future appointments.  Jane Bossier, MD

## 2022-05-06 ENCOUNTER — Encounter: Payer: Self-pay | Admitting: Obstetrics & Gynecology

## 2022-05-12 ENCOUNTER — Encounter: Payer: Self-pay | Admitting: Advanced Practice Midwife

## 2022-05-20 ENCOUNTER — Other Ambulatory Visit (HOSPITAL_COMMUNITY)
Admission: RE | Admit: 2022-05-20 | Discharge: 2022-05-20 | Disposition: A | Discharge status: Home / Self Care | Payer: BC Managed Care – PPO | Source: Ambulatory Visit | Attending: Licensed Practical Nurse | Admitting: Licensed Practical Nurse

## 2022-05-20 ENCOUNTER — Encounter: Payer: Self-pay | Admitting: Licensed Practical Nurse

## 2022-05-20 ENCOUNTER — Ambulatory Visit (INDEPENDENT_AMBULATORY_CARE_PROVIDER_SITE_OTHER): Payer: BC Managed Care – PPO | Admitting: Licensed Practical Nurse

## 2022-05-20 VITALS — BP 119/71 | HR 94 | Wt 186.5 lb

## 2022-05-20 DIAGNOSIS — Z3685 Encounter for antenatal screening for Streptococcus B: Secondary | ICD-10-CM

## 2022-05-20 DIAGNOSIS — Z3403 Encounter for supervision of normal first pregnancy, third trimester: Secondary | ICD-10-CM | POA: Insufficient documentation

## 2022-05-20 DIAGNOSIS — Z113 Encounter for screening for infections with a predominantly sexual mode of transmission: Secondary | ICD-10-CM | POA: Insufficient documentation

## 2022-05-20 DIAGNOSIS — Z3A36 36 weeks gestation of pregnancy: Secondary | ICD-10-CM

## 2022-05-20 LAB — POCT URINALYSIS DIPSTICK
Bilirubin, UA: NEGATIVE
Blood, UA: NEGATIVE
Glucose, UA: NEGATIVE
Ketones, UA: NEGATIVE
Leukocytes, UA: NEGATIVE
Nitrite, UA: NEGATIVE
Protein, UA: NEGATIVE
Spec Grav, UA: 1.01 (ref 1.010–1.025)
Urobilinogen, UA: 1 E.U./dL
pH, UA: 6 (ref 5.0–8.0)

## 2022-05-20 NOTE — Progress Notes (Signed)
Routine Prenatal Care Visit  Subjective  Jane Cordova is a 22 y.o. G1P0 at [redacted]w[redacted]d being seen today for ongoing prenatal care.  She is currently monitored for the following issues for this low-risk pregnancy and has Supervision of normal pregnancy; History of arthroscopy of knee; Pain in joint of left knee; and Decreased fetal movements in third trimester on their problem list.  ----------------------------------------------------------------------------------- Patient reports no complaints.  Doing well. Mood is good. -Wondering about breastfeeding and pumping, has a BF book -desires circ -her boyfriend and mom mom will be her labor support, they have plenty of PP support   Contractions: Irritability. Vag. Bleeding: None.  Movement: Present. Leaking Fluid denies.  ----------------------------------------------------------------------------------- The following portions of the patient's history were reviewed and updated as appropriate: allergies, current medications, past family history, past medical history, past social history, past surgical history and problem list. Problem list updated.  Objective  Blood pressure 119/71, pulse 94, weight 186 lb 8 oz (84.6 kg), last menstrual period 09/10/2021. Pregravid weight 143 lb (64.9 kg) Total Weight Gain 43 lb 8 oz (19.7 kg) Urinalysis: Urine Protein    Urine Glucose    Fetal Status:   Fundal Height: 36 cm Movement: Present     General:  Alert, oriented and cooperative. Patient is in no acute distress.  Skin: Skin is warm and dry. No rash noted.   Cardiovascular: Normal heart rate noted  Respiratory: Normal respiratory effort, no problems with respiration noted  Abdomen: Soft, gravid, appropriate for gestational age. Pain/Pressure: Absent     Pelvic:  Cervical exam deferred        Extremities: Normal range of motion.     Mental Status: Normal mood and affect. Normal behavior. Normal judgment and thought content.   Assessment   22 y.o. G1P0  at [redacted]w[redacted]d by  06/16/2022, by Ultrasound presenting for routine prenatal visit  Plan   FIRST Problems (from 12/24/21 to present)     Problem Noted Resolved   Supervision of normal pregnancy 12/23/2021 by Donnetta Hail, CMA No   Overview Addendum 03/24/2022  2:49 PM by Burtis Junes, CMA     Clinical Staff Provider  Office Location  [facility] Dating  Not found.  Language  English Anatomy US    Flu Vaccine  Offer Genetic Screen  NIPS:   TDaP vaccine   03/24/22 Hgb A1C or  GTT Early : Third trimester :   Covid Declines   LAB RESULTS   Rhogam     Blood Type   A Positive  Feeding Plan Breastfeed Antibody  negative  Contraception Declines Rubella  Immune 2.51  Circumcision Yes RPR   Non reactive   Pediatrician  Undecided HBsAg   Negative  Support Person Curator and Mom HIV  Non Reactive  Prenatal Classes Yes Varicella Negative    GBS  (For PCN allergy, check sensitivities)   BTL Consent  Hep C   negative  VBAC Consent  Pap No results found for: "DIAGPAP"    Hgb Electro      CF      SMA                   Preterm labor symptoms and general obstetric precautions including but not limited to vaginal bleeding, contractions, leaking of fluid and fetal movement were reviewed in detail with the patient. Please refer to After Visit Summary for other counseling recommendations.   Return in about 1 week (around 05/27/2022) for ROB.  36wk labs collected  Carie Caddy, CNM   [facility]  05/20/22  1:41 PM

## 2022-05-23 LAB — CERVICOVAGINAL ANCILLARY ONLY
Chlamydia: NEGATIVE
Comment: NEGATIVE
Comment: NORMAL

## 2022-05-23 LAB — CULTURE, BETA STREP (GROUP B ONLY): Strep Gp B Culture: POSITIVE — AB

## 2022-05-24 ENCOUNTER — Encounter: Payer: Self-pay | Admitting: Licensed Practical Nurse

## 2022-05-24 LAB — CERVICOVAGINAL ANCILLARY ONLY: Neisseria Gonorrhea: NEGATIVE

## 2022-05-25 ENCOUNTER — Ambulatory Visit (INDEPENDENT_AMBULATORY_CARE_PROVIDER_SITE_OTHER): Payer: BC Managed Care – PPO | Admitting: Obstetrics

## 2022-05-25 VITALS — BP 122/72 | HR 95 | Wt 191.0 lb

## 2022-05-25 DIAGNOSIS — Z3A36 36 weeks gestation of pregnancy: Secondary | ICD-10-CM

## 2022-05-25 DIAGNOSIS — Z3403 Encounter for supervision of normal first pregnancy, third trimester: Secondary | ICD-10-CM

## 2022-05-25 LAB — POCT URINALYSIS DIPSTICK OB
Bilirubin, UA: NEGATIVE
Blood, UA: NEGATIVE
Glucose, UA: NEGATIVE
Ketones, UA: NEGATIVE
Leukocytes, UA: NEGATIVE
Nitrite, UA: NEGATIVE
POC,PROTEIN,UA: NEGATIVE
Spec Grav, UA: 1.01 (ref 1.010–1.025)
Urobilinogen, UA: 0.2 E.U./dL
pH, UA: 6 (ref 5.0–8.0)

## 2022-05-25 NOTE — Progress Notes (Signed)
Routine Prenatal Care Visit  Subjective  Jane Cordova is a 22 y.o. G1P0 at [redacted]w[redacted]d being seen today for ongoing prenatal care.  She is currently monitored for the following issues for this low-risk pregnancy and has Supervision of normal pregnancy; History of arthroscopy of knee; and Pain in joint of left knee on their problem list.  ----------------------------------------------------------------------------------- Patient reports no complaints.   Contractions: Irritability. Vag. Bleeding: None.  Movement: Present. Leaking Fluid denies.  ----------------------------------------------------------------------------------- The following portions of the patient's history were reviewed and updated as appropriate: allergies, current medications, past family history, past medical history, past social history, past surgical history and problem list. Problem list updated.  Objective  Blood pressure 122/72, pulse 95, weight 191 lb (86.6 kg), last menstrual period 09/10/2021. Pregravid weight 143 lb (64.9 kg) Total Weight Gain 48 lb (21.8 kg) Urinalysis: Urine Protein Negative  Urine Glucose Negative  Fetal Status: Fetal Heart Rate (bpm): 149 Fundal Height: 35 cm Movement: Present  Presentation: Vertex  General:  Alert, oriented and cooperative. Patient is in no acute distress.  Skin: Skin is warm and dry. No rash noted.   Cardiovascular: Normal heart rate noted  Respiratory: Normal respiratory effort, no problems with respiration noted  Abdomen: Soft, gravid, appropriate for gestational age. Pain/Pressure: Absent     Pelvic:  Cervical exam deferred        Extremities: Normal range of motion.  Edema: None  Mental Status: Normal mood and affect. Normal behavior. Normal judgment and thought content.   Assessment   22 y.o. G1P0 at [redacted]w[redacted]d by  06/16/2022, by Ultrasound presenting for routine prenatal visit  Plan   FIRST Problems (from 12/24/21 to present)     Problem Noted Resolved   Supervision of  normal pregnancy 12/23/2021 by Donnetta Hail, CMA No   Overview Addendum 05/25/2022 10:57 AM by [name], CNM     Clinical Staff Provider  Office Location  [facility] Dating  L and 20wks [place]   Language  English Anatomy US  Normal, normal fu   Flu Vaccine  Offer Genetic Screen  NIPS: declined   TDaP vaccine   03/24/22 Hgb A1C or  GTT Early : Third trimester : 73  Covid Declines   LAB RESULTS   Rhogam    NA Blood Type   A Positive  Feeding Plan Breastfeed Antibody  negative  Contraception Declines Rubella  Immune 2.51  Circumcision Yes RPR   Non reactive   Pediatrician  Undecided HBsAg   Negative  Support Person Curator and Mom HIV  Non Reactive  Prenatal Classes Yes Varicella Negative    GBS  (For PCN allergy, check sensitivities) positive  BTL Consent  Hep C   negative  VBAC Consent  Pap No results found for: "DIAGPAP"    Hgb Electro      CF      SMA                   Preterm labor symptoms and general obstetric precautions including but not limited to vaginal bleeding, contractions, leaking of fluid and fetal movement were reviewed in detail with the patient. Please refer to After Visit Summary for other counseling recommendations.   Her GBS was positive. She understands that she will receive IV antibiotics during her labor.  Return in about 1 week (around 06/01/2022) for return OB.  [name], CNM  05/25/2022 10:58 AM

## 2022-06-03 ENCOUNTER — Ambulatory Visit (INDEPENDENT_AMBULATORY_CARE_PROVIDER_SITE_OTHER): Payer: BC Managed Care – PPO | Admitting: Advanced Practice Midwife

## 2022-06-03 ENCOUNTER — Encounter: Payer: Self-pay | Admitting: Advanced Practice Midwife

## 2022-06-03 VITALS — BP 119/79 | HR 99 | Wt 194.0 lb

## 2022-06-03 DIAGNOSIS — Z3403 Encounter for supervision of normal first pregnancy, third trimester: Secondary | ICD-10-CM

## 2022-06-03 DIAGNOSIS — Z3A38 38 weeks gestation of pregnancy: Secondary | ICD-10-CM

## 2022-06-03 LAB — POCT URINALYSIS DIPSTICK OB
Bilirubin, UA: NEGATIVE
Blood, UA: NEGATIVE
Glucose, UA: NEGATIVE
Ketones, UA: NEGATIVE
Leukocytes, UA: NEGATIVE
Nitrite, UA: NEGATIVE
POC,PROTEIN,UA: NEGATIVE
Spec Grav, UA: 1.01 (ref 1.010–1.025)
Urobilinogen, UA: 1 E.U./dL
pH, UA: 7 (ref 5.0–8.0)

## 2022-06-03 NOTE — Progress Notes (Signed)
Routine Prenatal Care Visit  Subjective  Jane Cordova is a 22 y.o. G1P0 at [redacted]w[redacted]d being seen today for ongoing prenatal care.  She is currently monitored for the following issues for this low-risk pregnancy and has Supervision of normal pregnancy; History of arthroscopy of knee; and Pain in joint of left knee on their problem list.  ----------------------------------------------------------------------------------- Patient reports  groin pain especially when walking. Reviewed comfort measures. She has started doing the miles circuit .   Contractions: Not present. Vag. Bleeding: None.  Movement: Present. Leaking Fluid denies.  ----------------------------------------------------------------------------------- The following portions of the patient's history were reviewed and updated as appropriate: allergies, current medications, past family history, past medical history, past social history, past surgical history and problem list. Problem list updated.  Objective  Blood pressure 119/79, pulse 99, weight 194 lb (88 kg), last menstrual period 09/10/2021. Pregravid weight 143 lb (64.9 kg) Total Weight Gain 51 lb (23.1 kg) Urinalysis: Urine Protein Negative  Urine Glucose Negative  Fetal Status: Fetal Heart Rate (bpm): 138 Fundal Height: 36 cm Movement: Present     General:  Alert, oriented and cooperative. Patient is in no acute distress.  Skin: Skin is warm and dry. No rash noted.   Cardiovascular: Normal heart rate noted  Respiratory: Normal respiratory effort, no problems with respiration noted  Abdomen: Soft, gravid, appropriate for gestational age. Pain/Pressure: Present     Pelvic:  Cervical exam deferred        Extremities: Normal range of motion.  Edema: None  Mental Status: Normal mood and affect. Normal behavior. Normal judgment and thought content.   Assessment   22 y.o. G1P0 at [redacted]w[redacted]d by  06/16/2022, by Ultrasound presenting for routine prenatal visit  Plan   FIRST Problems  (from 12/24/21 to present)     Problem Noted Resolved   Supervision of normal pregnancy 12/23/2021 by Donnetta Hail, CMA No   Overview Addendum 05/25/2022 10:57 AM by [name], CNM     Clinical Staff Provider  Office Location  [facility] Dating  L and 20wks [place]   Language  English Anatomy US  Normal, normal fu   Flu Vaccine  Offer Genetic Screen  NIPS: declined   TDaP vaccine   03/24/22 Hgb A1C or  GTT Early : Third trimester : 54  Covid Declines   LAB RESULTS   Rhogam    NA Blood Type   A Positive  Feeding Plan Breastfeed Antibody  negative  Contraception Declines Rubella  Immune 2.51  Circumcision Yes RPR   Non reactive   Pediatrician  Undecided HBsAg   Negative  Support Person Curator and Mom HIV  Non Reactive  Prenatal Classes Yes Varicella Negative    GBS  (For PCN allergy, check sensitivities) positive  BTL Consent  Hep C   negative  VBAC Consent  Pap No results found for: "DIAGPAP"    Hgb Electro      CF      SMA                   Term labor symptoms and general obstetric precautions including but not limited to vaginal bleeding, contractions, leaking of fluid and fetal movement were reviewed in detail with the patient. Please refer to After Visit Summary for other counseling recommendations.   Return in about 1 week (around 06/10/2022) for rob.  Tresea Mall, CNM 06/03/2022 1:32 PM

## 2022-06-03 NOTE — Patient Instructions (Signed)
Pain Relief During Labor and Delivery Many things can cause pain during labor and delivery, including: Pressure due to the baby moving through the pelvis. Stretching of tissues due to the baby moving through the birth canal. Muscle tension due to anxiety or nervousness. The uterus tightening (contracting)and relaxing to help move the baby. How do I get pain relief during labor and delivery?  Discuss your pain relief options with your health care provider during your prenatal visits. Explore the options offered by your hospital or birth center. There are many ways to deal with the pain of labor and delivery. You can try relaxation techniques or doing relaxing activities, taking a warm shower or bath (hydrotherapy), or other methods. There are also many medicines available to help control pain. Relaxation techniques and activities Practice relaxation techniques or do relaxing activities, such as: Focused breathing. Meditation. Visualization. Aroma therapy. Listening to your favorite music. Hypnosis. Hydrotherapy Take a warm shower or bath. This may: Provide comfort and relaxation. Lessen your feeling of pain. Reduce the amount of pain medicine needed. Shorten the length of labor. Other methods Try doing other things, such as: Getting a massage or having counterpressure on your back. Applying warm packs or ice packs. Changing positions often, moving around, or using a birthing ball. Medicines You may be given: Pain medicine through an IV or an injection into a muscle. Pain medicine inserted into your spinal column. Injections of sterile water just under the skin on your lower back. Nitrous oxide inhalation therapy, also called laughing gas. What kinds of medicine are available for pain relief? There are two kinds of medicines that can be used to relieve pain during labor and delivery: Analgesics. These medicines decrease pain without causing you to lose feeling or the ability to move  your muscles. Anesthetics. These medicines block feeling in the body and can decrease your ability to move freely. Both kinds of medicine can cause minor side effects, such as nausea, trouble concentrating, and sleepiness. They can also affect the baby's heart rate before birth and his or her breathing after birth. For this reason, health care providers are careful about when and how much medicine is given. Which medicines are used to provide pain relief? Common medicines The most common medicines used to help manage pain during labor and delivery include: Opioids. Opioids are medicines that decrease how much pain you feel (perception of pain). These medicines can be given through an IV or may be used with anesthetics to block pain. Epidural analgesia. Epidural analgesia is given through a very thin tube that is inserted into the lower back. Medicine is delivered continuously to the area near your spinal column nerves (epidural space). After having this treatment, you may be able to move your legs, but you will not be able to walk. Depending on the amount and type of medicine given, you may lose all feeling in the lower half of your body, or you may have some sensation, including the urge to push. This treatment can be used to give pain relief for a vaginal birth. Sometimes, a numbing medicine is injected into the spinal fluid when an epidural catheter is placed. This provides for immediate relief but only lasts for 1-2 hours. Once it wears off, the epidural will provide pain relief. This is called a combined spinal-epidural (CSE) block. Intrathecal analgesia (spinal analgesia). Intrathecal analgesia is similar to epidural analgesia, but the medicine is injected into the spinal fluid instead of the epidural space. It is usually only given once.   It starts to relieve pain quickly, but the pain relief lasts only 1-2 hours. Pudendal block. This block is done by injecting numbing medicine through the wall of  the vagina and into a nerve in the pelvis. Other medicines Other medicines used to help manage pain during labor and delivery include: Local anesthetics. These are used to numb a small area of the body. They may be used along with another kind of medicine or used to numb the nerves of the vagina, cervix, and perineum during the second stage of labor. Spinal block (spinal anesthesia). Spinal anesthesia is similar to spinal analgesia, but the medicine that is used contains longer-acting numbing medicines and pain medicines. This type of anesthesia can be used for a cesarean delivery and allows you to stay awake for the birth of your baby. General anesthetics cause you to lose consciousness so you do not feel pain. They are usually only used for an emergency cesarean delivery. These medicines are given through an IV or a mask or both. These medicines are used as part of a procedure or for an emergency delivery. Summary Women have many options to help them manage the pain associated with labor and delivery. You can try doing relaxing activities, taking a warm shower or bath, or other methods. There are also many medicines available to help control pain during labor and delivery. Talk with your health care provider about what options are available to you. This information is not intended to replace advice given to you by your health care provider. Make sure you discuss any questions you have with your health care provider. Document Revised: 11/28/2018 Document Reviewed: 11/28/2018 Elsevier Patient Education  2023 Else[employer] Inc[employer] Signs and Symptoms of Labor Labor is the body's natural process of moving the baby and the placenta out of the uterus. The process of labor usually starts when the baby is full-term, between 39 and 41 weeks of pregnancy. Signs and symptoms that you are close to going into labor As your body prepares for labor and the birth of your baby, you may notice the following symptoms in  the weeks and days before true labor starts: Passing a small amount of thick, bloody mucus from your vagina. This is called normal bloody show or losing your mucus plug. This may happen more than a week before labor begins, or right before labor begins, as the opening of the cervix starts to widen (dilate). For some women, the entire mucus plug passes at once. For others, pieces of the mucus plug may gradually pass over several days. Your baby moving (dropping) lower in your pelvis to get into position for birth (lightening). When this happens, you may feel more pressure on your bladder and pelvic bone and less pressure on your ribs. This may make it easier to breathe. It may also cause you to need to urinate more often and have problems with bowel movements. Having "practice contractions," also called Braxton Hicks contractions or false labor. These occur at irregular (unevenly spaced) intervals that are more than 10 minutes apart. False labor contractions are common after exercise or sexual activity. They will stop if you change position, rest, or drink fluids. These contractions are usually mild and do not get stronger over time. They may feel like: A backache or back pain. Mild cramps, similar to menstrual cramps. Tightening or pressure in your abdomen. Other early symptoms include: Nausea or loss of appetite. Diarrhea. Having a sudden burst of energy, or feeling very tired. Mood changes. Having trouble   sleeping. Signs and symptoms that labor has begun Signs that you are in labor may include: Having contractions that come at regular (evenly spaced) intervals and increase in intensity. This may feel like more intense tightening or pressure in your abdomen that moves to your back. Contractions may also feel like rhythmic pain in your upper thighs or back that comes and goes at regular intervals. If you are delivering for the first time, this change in intensity of contractions often occurs at a  more gradual pace. If you have given birth before, you may notice a more rapid progression of contraction changes. Feeling pressure in the vaginal area. Your water breaking (rupture of membranes). This is when the sac of fluid that surrounds your baby breaks. Fluid leaking from your vagina may be clear or blood-tinged. Labor usually starts within 24 hours of your water breaking, but it may take longer to begin. Some people may feel a sudden gush of fluid; others may notice repeatedly damp underwear. Follow these instructions at home:  When labor starts, or if your water breaks, call your health care provider or nurse care line. Based on your situation, they will determine when you should go in for an exam. During early labor, you may be able to rest and manage symptoms at home. Some strategies to try at home include: Breathing and relaxation techniques. Taking a warm bath or shower. Listening to music. Using a heating pad on the lower back for pain. If directed, apply heat to the area as often as told by your health care provider. Use the heat source that your health care provider recommends, such as a moist heat pack or a heating pad. Place a towel between your skin and the heat source. Leave the heat on for 20-30 minutes. Remove the heat if your skin turns bright red. This is especially important if you are unable to feel pain, heat, or cold. You have a greater risk of getting burned. Contact a health care provider if: Your labor has started. Your water breaks. You have nausea, vomiting, or diarrhea. Get help right away if: You have painful, regular contractions that are 5 minutes apart or less. Labor starts before you are [redacted] weeks along in your pregnancy. You have a fever. You have bright red blood coming from your vagina. You do not feel your baby moving. You have a severe headache with or without vision problems. You have chest pain or shortness of breath. These symptoms may  represent a serious problem that is an emergency. Do not wait to see if the symptoms will go away. Get medical help right away. Call your local emergency services (911 in the [place] Do not drive yourself to the hospital. Summary Labor is your body's natural process of moving your baby and the placenta out of your uterus. The process of labor usually starts when your baby is full-term, between 39 and 40 weeks of pregnancy. When labor starts, or if your water breaks, call your health care provider or nurse care line. Based on your situation, they will determine when you should go in for an exam. This information is not intended to replace advice given to you by your health care provider. Make sure you discuss any questions you have with your health care provider. Document Revised: 05/26/2020 Document Reviewed: 05/26/2020 Elsevier Patient Education  2023 Elsevier Inc.  

## 2022-06-07 ENCOUNTER — Telehealth: Payer: BC Managed Care – PPO | Admitting: Family Medicine

## 2022-06-07 DIAGNOSIS — N898 Other specified noninflammatory disorders of vagina: Secondary | ICD-10-CM

## 2022-06-07 NOTE — Progress Notes (Signed)
For the safety of you and your child, I recommend a face to face office visit with a health care provider.   Many mothers need to take medicines during their pregnancy and while nursing.  Almost all medicines pass into the breast milk in small quantities.  Most are generally considered safe for a mother to take but some medicines must be avoided.  After reviewing your E-Visit request, I recommend that you consult your OB/GYN or pediatrician for medical advice in relation to your condition and prescription medications while pregnant or breastfeeding.   NOTE:  There will be NO CHARGE for this eVisit   

## 2022-06-10 ENCOUNTER — Encounter: Payer: Self-pay | Admitting: Licensed Practical Nurse

## 2022-06-10 ENCOUNTER — Ambulatory Visit (INDEPENDENT_AMBULATORY_CARE_PROVIDER_SITE_OTHER): Payer: BC Managed Care – PPO | Admitting: Licensed Practical Nurse

## 2022-06-10 VITALS — BP 127/80 | HR 94 | Wt 196.7 lb

## 2022-06-10 DIAGNOSIS — Z3A39 39 weeks gestation of pregnancy: Secondary | ICD-10-CM

## 2022-06-10 DIAGNOSIS — Z3403 Encounter for supervision of normal first pregnancy, third trimester: Secondary | ICD-10-CM

## 2022-06-10 LAB — POCT URINALYSIS DIPSTICK
Bilirubin, UA: NEGATIVE
Blood, UA: NEGATIVE
Glucose, UA: NEGATIVE
Ketones, UA: NEGATIVE
Leukocytes, UA: NEGATIVE
Nitrite, UA: NEGATIVE
Protein, UA: NEGATIVE
Spec Grav, UA: 1.01 (ref 1.010–1.025)
Urobilinogen, UA: 0.2 E.U./dL
pH, UA: 6.5 (ref 5.0–8.0)

## 2022-06-10 NOTE — Progress Notes (Signed)
Routine Prenatal Care Visit  Subjective  Jane Cordova is a 22 y.o. G1P0 at [redacted]w[redacted]d being seen today for ongoing prenatal care.  She is currently monitored for the following issues for this low-risk pregnancy and has Supervision of normal pregnancy; History of arthroscopy of knee; and Pain in joint of left knee on their problem list.  ----------------------------------------------------------------------------------- Patient reports no complaints.  Doing well. Is ready for baby -reviewed postdates POC reviewed, IOL at 41 wks, BPP after 40wks. Will schedule IOL at next visit.   Contractions: Irritability. Vag. Bleeding: None.  Movement: Present. Leaking Fluid denies.  ----------------------------------------------------------------------------------- The following portions of the patient's history were reviewed and updated as appropriate: allergies, current medications, past family history, past medical history, past social history, past surgical history and problem list. Problem list updated.  Objective  Blood pressure 127/80, pulse 94, weight 196 lb 11.2 oz (89.2 kg), last menstrual period 09/10/2021. Pregravid weight 143 lb (64.9 kg) Total Weight Gain 53 lb 11.2 oz (24.4 kg) Urinalysis: Urine Protein    Urine Glucose    Fetal Status: Fetal Heart Rate (bpm): 135 Fundal Height: 37 cm Movement: Present     General:  Alert, oriented and cooperative. Patient is in no acute distress.  Skin: Skin is warm and dry. No rash noted.   Cardiovascular: Normal heart rate noted  Respiratory: Normal respiratory effort, no problems with respiration noted  Abdomen: Soft, gravid, appropriate for gestational age. Pain/Pressure: Present     Pelvic:  Cervical exam performed Dilation: Fingertip Effacement (%): 50 Station: -1  Extremities: Normal range of motion.     Mental Status: Normal mood and affect. Normal behavior. Normal judgment and thought content.   Assessment   22 y.o. G1P0 at [redacted]w[redacted]d by  06/16/2022,  by Ultrasound presenting for routine prenatal visit  Plan   FIRST Problems (from 12/24/21 to present)     Problem Noted Resolved   Supervision of normal pregnancy 12/23/2021 by Donnetta Hail, CMA No   Overview Addendum 05/25/2022 10:57 AM by [name], CNM     Clinical Staff Provider  Office Location  [facility] Dating  L and 20wks [place]   Language  English Anatomy US  Normal, normal fu   Flu Vaccine  Offer Genetic Screen  NIPS: declined   TDaP vaccine   03/24/22 Hgb A1C or  GTT Early : Third trimester : 36  Covid Declines   LAB RESULTS   Rhogam    NA Blood Type   A Positive  Feeding Plan Breastfeed Antibody  negative  Contraception Declines Rubella  Immune 2.51  Circumcision Yes RPR   Non reactive   Pediatrician  Undecided HBsAg   Negative  Support Person Curator and Mom HIV  Non Reactive  Prenatal Classes Yes Varicella Negative    GBS  (For PCN allergy, check sensitivities) positive  BTL Consent  Hep C   negative  VBAC Consent  Pap No results found for: "DIAGPAP"    Hgb Electro      CF      SMA                   Term labor symptoms and general obstetric precautions including but not limited to vaginal bleeding, contractions, leaking of fluid and fetal movement were reviewed in detail with the patient. Please refer to After Visit Summary for other counseling recommendations.   Return in about 1 week (around 06/17/2022) for ROB.  Carie Caddy, CNM   [facility]  06/10/22  5:26 PM

## 2022-06-14 ENCOUNTER — Encounter: Payer: Self-pay | Admitting: Licensed Practical Nurse

## 2022-06-16 ENCOUNTER — Encounter: Payer: Self-pay | Admitting: Certified Nurse Midwife

## 2022-06-16 ENCOUNTER — Ambulatory Visit (INDEPENDENT_AMBULATORY_CARE_PROVIDER_SITE_OTHER): Payer: BC Managed Care – PPO | Admitting: Certified Nurse Midwife

## 2022-06-16 VITALS — BP 129/84 | HR 119 | Wt 196.4 lb

## 2022-06-16 DIAGNOSIS — O99820 Streptococcus B carrier state complicating pregnancy: Secondary | ICD-10-CM

## 2022-06-16 DIAGNOSIS — Z3403 Encounter for supervision of normal first pregnancy, third trimester: Secondary | ICD-10-CM

## 2022-06-16 DIAGNOSIS — Z3A40 40 weeks gestation of pregnancy: Secondary | ICD-10-CM

## 2022-06-16 DIAGNOSIS — O480 Post-term pregnancy: Secondary | ICD-10-CM

## 2022-06-16 LAB — POCT URINALYSIS DIPSTICK
Bilirubin, UA: NEGATIVE
Blood, UA: NEGATIVE
Glucose, UA: NEGATIVE
Ketones, UA: NEGATIVE
Leukocytes, UA: NEGATIVE
Nitrite, UA: NEGATIVE
Protein, UA: NEGATIVE
Spec Grav, UA: 1.015 (ref 1.010–1.025)
Urobilinogen, UA: 0.2 E.U./dL
pH, UA: 5.5 (ref 5.0–8.0)

## 2022-06-16 NOTE — Progress Notes (Signed)
   PRENATAL VISIT NOTE  Subjective:  Jane Cordova is a 22 y.o. G1P0 at [redacted]w[redacted]d being seen today for ongoing prenatal care.  She is currently monitored for the following issues for this low-risk pregnancy and has Supervision of normal pregnancy; History of arthroscopy of knee; and Pain in joint of left knee on their problem list.  Patient reports no complaints.  Contractions: Irritability. Vag. Bleeding: None.  Movement: Present. Denies leaking of fluid.   The following portions of the patient's history were reviewed and updated as appropriate: allergies, current medications, past family history, past medical history, past social history, past surgical history and problem list.   Objective:   Vitals:   06/16/22 1525  BP: 129/84  Pulse: (!) 119  Weight: 196 lb 6.4 oz (89.1 kg)   Total weight gain: 53 lb 6.4 oz (24.2 kg) Fetal Status: Fetal Heart Rate (bpm): 140 Fundal Height: 38 cm Movement: Present  Presentation: Vertex   General:  Alert, oriented and cooperative. Patient is in no acute distress.  Skin: Skin is warm and dry. No rash noted.   Cardiovascular: Normal heart rate noted  Respiratory: Normal respiratory effort, no problems with respiration noted  Abdomen: Soft, gravid, appropriate for gestational age.  Pain/Pressure: Absent     Pelvic:  Dilation: Fingertip Effacement (%): 60 Station: -2  Extremities: Normal range of motion.  Edema: None  Mental Status: Normal mood and affect. Normal behavior. Normal judgment and thought content.   Assessment and Plan:  Pregnancy: G1P0 at [redacted]w[redacted]d 1. [redacted] weeks gestation of pregnancy  - POCT Urinalysis Dipstick  2. Encounter for supervision of normal first pregnancy in third trimester  - POCT Urinalysis Dipstick  3. GBS (group B Streptococcus carrier), +RV culture, currently pregnant   Term labor symptoms and general obstetric precautions including but not limited to vaginal bleeding, contractions, leaking of fluid and fetal movement  were reviewed in detail with the patient. Please refer to After Visit Summary for other counseling recommendations.  PDIOL scheduled 5/30 at 41+0, 5am arrival. Reviewed process of IOL.  Return in about 1 day (around 06/17/2022) for ultrasound, induction of labor scheduled 5/30 5am arrival.  Future Appointments  Date Time Provider Department Center  06/17/2022  4:30 PM [facility]    Dominica Severin, CNM

## 2022-06-17 ENCOUNTER — Encounter: Payer: Self-pay | Admitting: Licensed Practical Nurse

## 2022-06-17 ENCOUNTER — Ambulatory Visit
Admission: RE | Admit: 2022-06-17 | Discharge: 2022-06-17 | Disposition: A | Discharge status: Home / Self Care | Payer: BC Managed Care – PPO | Source: Ambulatory Visit | Attending: Licensed Practical Nurse | Admitting: Licensed Practical Nurse

## 2022-06-17 DIAGNOSIS — Z3A39 39 weeks gestation of pregnancy: Secondary | ICD-10-CM | POA: Diagnosis not present

## 2022-06-17 DIAGNOSIS — Z3403 Encounter for supervision of normal first pregnancy, third trimester: Secondary | ICD-10-CM | POA: Insufficient documentation

## 2022-06-17 DIAGNOSIS — O480 Post-term pregnancy: Secondary | ICD-10-CM | POA: Diagnosis not present

## 2022-06-17 DIAGNOSIS — Z3689 Encounter for other specified antenatal screening: Secondary | ICD-10-CM | POA: Diagnosis not present

## 2022-06-17 DIAGNOSIS — Z3A38 38 weeks gestation of pregnancy: Secondary | ICD-10-CM | POA: Diagnosis not present

## 2022-06-21 ENCOUNTER — Encounter: Payer: Self-pay | Admitting: Certified Nurse Midwife

## 2022-06-22 ENCOUNTER — Other Ambulatory Visit: Payer: Self-pay | Admitting: Certified Nurse Midwife

## 2022-06-22 DIAGNOSIS — Z3403 Encounter for supervision of normal first pregnancy, third trimester: Secondary | ICD-10-CM

## 2022-06-23 ENCOUNTER — Inpatient Hospital Stay: Payer: BC Managed Care – PPO | Admitting: Anesthesiology

## 2022-06-23 ENCOUNTER — Other Ambulatory Visit: Payer: Self-pay

## 2022-06-23 ENCOUNTER — Encounter: Payer: Self-pay | Admitting: Obstetrics and Gynecology

## 2022-06-23 ENCOUNTER — Inpatient Hospital Stay
Admission: EM | Admit: 2022-06-23 | Discharge: 2022-06-25 | DRG: 807 | Disposition: A | Discharge status: Home / Self Care | Payer: BC Managed Care – PPO | Attending: Obstetrics and Gynecology | Admitting: Obstetrics and Gynecology

## 2022-06-23 DIAGNOSIS — O480 Post-term pregnancy: Secondary | ICD-10-CM | POA: Diagnosis not present

## 2022-06-23 DIAGNOSIS — Z3A41 41 weeks gestation of pregnancy: Secondary | ICD-10-CM

## 2022-06-23 DIAGNOSIS — O99824 Streptococcus B carrier state complicating childbirth: Secondary | ICD-10-CM | POA: Diagnosis not present

## 2022-06-23 DIAGNOSIS — Z3403 Encounter for supervision of normal first pregnancy, third trimester: Secondary | ICD-10-CM

## 2022-06-23 LAB — TYPE AND SCREEN
ABO/RH(D): A POS
Antibody Screen: NEGATIVE

## 2022-06-23 LAB — RPR: RPR Ser Ql: NONREACTIVE

## 2022-06-23 LAB — CBC
HCT: 32.1 % — ABNORMAL LOW (ref 36.0–46.0)
Hemoglobin: 10.5 g/dL — ABNORMAL LOW (ref 12.0–15.0)
MCH: 27.1 pg (ref 26.0–34.0)
MCHC: 32.7 g/dL (ref 30.0–36.0)
MCV: 82.7 fL (ref 80.0–100.0)
Platelets: 249 10*3/uL (ref 150–400)
RBC: 3.88 MIL/uL (ref 3.87–5.11)
RDW: 13 % (ref 11.5–15.5)
WBC: 12 10*3/uL — ABNORMAL HIGH (ref 4.0–10.5)
nRBC: 0 % (ref 0.0–0.2)

## 2022-06-23 LAB — ABO/RH: ABO/RH(D): A POS

## 2022-06-23 MED ORDER — OXYTOCIN-SODIUM CHLORIDE 30-0.9 UT/500ML-% IV SOLN
1.0000 m[IU]/min | INTRAVENOUS | Status: DC
  Filled 2022-06-23 (×2): qty 500

## 2022-06-23 MED ORDER — FLEET ENEMA 7-19 GM/118ML RE ENEM: 1.0000 | ENEMA | RECTAL | Status: DC | PRN

## 2022-06-23 MED ORDER — ACETAMINOPHEN 325 MG PO TABS
650.0000 mg | ORAL_TABLET | ORAL | Status: DC | PRN
  Administered 2022-06-23 – 2022-06-24 (×2): 650 mg via ORAL
  Filled 2022-06-23 (×2): qty 2

## 2022-06-23 MED ORDER — DIPHENHYDRAMINE HCL 50 MG/ML IJ SOLN: 12.5000 mg | INTRAMUSCULAR | Status: DC | PRN

## 2022-06-23 MED ORDER — LACTATED RINGERS IV SOLN: 500.0000 mL | INTRAVENOUS | Status: DC | PRN

## 2022-06-23 MED ORDER — PHENYLEPHRINE 80 MCG/ML (10ML) SYRINGE FOR IV PUSH (FOR BLOOD PRESSURE SUPPORT): 80.0000 ug | PREFILLED_SYRINGE | INTRAVENOUS | Status: DC | PRN

## 2022-06-23 MED ORDER — MISOPROSTOL 25 MCG QUARTER TABLET
25.0000 ug | ORAL_TABLET | Freq: Once | ORAL | Status: AC
  Administered 2022-06-23: 25 ug via VAGINAL
  Filled 2022-06-23: qty 1

## 2022-06-23 MED ORDER — LACTATED RINGERS IV SOLN: INTRAVENOUS | Status: DC

## 2022-06-23 MED ORDER — PENICILLIN G POT IN DEXTROSE 60000 UNIT/ML IV SOLN
3.0000 10*6.[IU] | INTRAVENOUS | Status: DC
  Administered 2022-06-23 – 2022-06-24 (×3): 3 10*6.[IU] via INTRAVENOUS
  Filled 2022-06-23 (×5): qty 50

## 2022-06-23 MED ORDER — ONDANSETRON HCL 4 MG/2ML IJ SOLN
4.0000 mg | Freq: Four times a day (QID) | INTRAMUSCULAR | Status: DC | PRN
  Administered 2022-06-23 – 2022-06-24 (×2): 4 mg via INTRAVENOUS
  Filled 2022-06-23 (×2): qty 2

## 2022-06-23 MED ORDER — OXYTOCIN 10 UNIT/ML IJ SOLN
INTRAMUSCULAR | Status: AC
  Filled 2022-06-23: qty 2

## 2022-06-23 MED ORDER — LIDOCAINE HCL (PF) 1 % IJ SOLN
INTRAMUSCULAR | Status: AC
  Filled 2022-06-23: qty 30

## 2022-06-23 MED ORDER — MISOPROSTOL 200 MCG PO TABS
ORAL_TABLET | ORAL | Status: AC
  Filled 2022-06-23: qty 4

## 2022-06-23 MED ORDER — OXYTOCIN BOLUS FROM INFUSION
333.0000 mL | Freq: Once | INTRAVENOUS | Status: AC
  Administered 2022-06-24: 333 mL via INTRAVENOUS

## 2022-06-23 MED ORDER — AMMONIA AROMATIC IN INHA
RESPIRATORY_TRACT | Status: AC
  Filled 2022-06-23: qty 10

## 2022-06-23 MED ORDER — OXYCODONE-ACETAMINOPHEN 5-325 MG PO TABS: 1.0000 | ORAL_TABLET | ORAL | Status: DC | PRN

## 2022-06-23 MED ORDER — LACTATED RINGERS IV SOLN
500.0000 mL | Freq: Once | INTRAVENOUS | Status: AC
  Administered 2022-06-23: 500 mL via INTRAVENOUS

## 2022-06-23 MED ORDER — EPHEDRINE 5 MG/ML INJ: 10.0000 mg | INTRAVENOUS | Status: DC | PRN

## 2022-06-23 MED ORDER — MISOPROSTOL 25 MCG QUARTER TABLET
ORAL_TABLET | ORAL | Status: AC
  Administered 2022-06-23: 25 ug via ORAL
  Filled 2022-06-23: qty 1

## 2022-06-23 MED ORDER — BUPIVACAINE HCL (PF) 0.25 % IJ SOLN
INTRAMUSCULAR | Status: DC | PRN
  Administered 2022-06-23: 8 mL via EPIDURAL

## 2022-06-23 MED ORDER — SODIUM CHLORIDE 0.9 % IV SOLN
5.0000 10*6.[IU] | Freq: Once | INTRAVENOUS | Status: AC
  Administered 2022-06-23: 5 10*6.[IU] via INTRAVENOUS
  Filled 2022-06-23: qty 5

## 2022-06-23 MED ORDER — SOD CITRATE-CITRIC ACID 500-334 MG/5ML PO SOLN: 30.0000 mL | ORAL | Status: DC | PRN

## 2022-06-23 MED ORDER — OXYTOCIN-SODIUM CHLORIDE 30-0.9 UT/500ML-% IV SOLN: 2.5000 [IU]/h | INTRAVENOUS | Status: DC

## 2022-06-23 MED ORDER — MISOPROSTOL 25 MCG QUARTER TABLET
25.0000 ug | ORAL_TABLET | Freq: Once | ORAL | Status: AC
  Administered 2022-06-23: 25 ug via ORAL
  Filled 2022-06-23: qty 1

## 2022-06-23 MED ORDER — TERBUTALINE SULFATE 1 MG/ML IJ SOLN: 0.2500 mg | Freq: Once | INTRAMUSCULAR | Status: DC | PRN

## 2022-06-23 MED ORDER — FENTANYL-BUPIVACAINE-NACL 0.5-0.125-0.9 MG/250ML-% EP SOLN
12.0000 mL/h | EPIDURAL | Status: DC | PRN
  Administered 2022-06-23: 12 mL/h via EPIDURAL
  Filled 2022-06-23: qty 250

## 2022-06-23 MED ORDER — LIDOCAINE-EPINEPHRINE (PF) 1.5 %-1:200000 IJ SOLN
INTRAMUSCULAR | Status: DC | PRN
  Administered 2022-06-23: 3 mL via EPIDURAL

## 2022-06-23 MED ORDER — OXYCODONE-ACETAMINOPHEN 5-325 MG PO TABS: 2.0000 | ORAL_TABLET | ORAL | Status: DC | PRN

## 2022-06-23 MED ORDER — LIDOCAINE HCL (PF) 1 % IJ SOLN: 30.0000 mL | INTRAMUSCULAR | Status: DC | PRN

## 2022-06-23 MED ORDER — OXYTOCIN-SODIUM CHLORIDE 30-0.9 UT/500ML-% IV SOLN
1.0000 m[IU]/min | INTRAVENOUS | Status: DC
  Administered 2022-06-23: 2 m[IU]/min via INTRAVENOUS

## 2022-06-23 NOTE — Progress Notes (Signed)
Jane Cordova is a 22 y.o. G1P0 at [redacted]w[redacted]d with continued induction.  Subjective:Very comfortable with epidural.    Objective: BP 110/62   Pulse 69   Temp 98.8 F (37.1 C) (Oral)   Resp 18   Ht 5\' 7"  (1.702 m)   Wt 88.9 kg   LMP 09/10/2021 (Approximate)   SpO2 99%   BMI 30.70 kg/m  I/O last 3 completed shifts: In: 722 [I.V.:722] Out: -  No intake/output data recorded.  FHT:  FHR: 125 bpm, variability: moderate,  accelerations:  Present,  decelerations:  Absent UC:   regular, every 2.5-3.5  minutes SVE:   Dilation: 4 Effacement (%): 80 Station: -2 Exam by:: Eunice Blase CNM Pitocin infusing at 79mu/min Labs: Lab Results  Component Value Date   WBC 12.0 (H) 06/23/2022   HGB 10.5 (L) 06/23/2022   HCT 32.1 (L) 06/23/2022   MCV 82.7 06/23/2022   PLT 249 06/23/2022    Assessment / Plan: Induction of labor due to postterm,  progressing well on pitocin  Labor: Progressing on Pitocin, will continue to increase then AROM  Fetal Wellbeing:  Category I Pain Control:  Epidural I/D:  n/a Anticipated MOD:  NSVD Plan for RN to recheck the patient in several hours.  [name], CNM 06/23/2022, 10:24 PM

## 2022-06-23 NOTE — Progress Notes (Signed)
Jane Cordova is a 22 y.o. G1P0 at [redacted]w[redacted]d who continues with induction of labor.  She is resting.Was initially dosed with oral and vaginal cytotec.  Subjective:Feeling some cramping, but is not very uncomfortable.   Objective: BP 137/68   Pulse 81   Temp 98.8 F (37.1 C) (Oral)   Resp 15   Ht 5\' 7"  (1.702 m)   Wt 88.9 kg   LMP 09/10/2021 (Approximate)   SpO2 98%   BMI 30.70 kg/m  No intake/output data recorded. Total I/O In: 722 [I.V.:722] Out: -   FHT:  FHR: 125 bpm, variability: moderate,  accelerations:  Present,  decelerations:  Absent UC:   irregular, every 5 minutes SVE:   Dilation: 1 Effacement (%): 60 Station: -2 Exam by:: Eunice Blase CNM Bishop score: 5-6  Labs: Lab Results  Component Value Date   WBC 12.0 (H) 06/23/2022   HGB 10.5 (L) 06/23/2022   HCT 32.1 (L) 06/23/2022   MCV 82.7 06/23/2022   PLT 249 06/23/2022    Assessment / Plan: Induction of labor due to postterm,  progressing well on pitocin  Labor:  foley catheter placed; infused with 30 ccs saline. Oral Cytotec 25 mcg.  Fetal Wellbeing:  Category I Pain Control:  Labor support without medications I/D:  n/a Anticipated MOD:  NSVD Dr. Valentino Saxon updated on maternal/fetal status. Will recheck in 4 hours or PRN.  [name], CNM 06/23/2022, 12:26 PM

## 2022-06-23 NOTE — H&P (Signed)
Jane Cordova is a 22 y.o. female presenting for induction of labor at [redacted] weeks gestation.she has received an initial dose of oral and vaginal cytotec. OB History     Gravida  1   Para      Term      Preterm      AB      Living         SAB      IAB      Ectopic      Multiple      Live Births             Past Medical History:  Diagnosis Date   No pertinent past medical history    Past Surgical History:  Procedure Laterality Date   OTHER SURGICAL HISTORY     Ligament Reconstruction on Left Knee   Family History: family history includes Breast cancer (age of onset: 90) in her maternal great-grandmother; Diabetes in her maternal grandfather and maternal grandmother; Healthy in her brother and father; Heart Problems in her maternal grandmother; Lung cancer in her paternal grandmother; Multiple myeloma (age of onset: 60) in her maternal great-grandmother; Skin cancer in her paternal grandfather and paternal grandmother. Social History:  reports that she has never smoked. She has never used smokeless tobacco. She reports that she does not currently use alcohol. She reports that she does not currently use drugs.     Maternal Diabetes: No Genetic Screening: Normal Maternal Ultrasounds/Referrals: Normal Fetal Ultrasounds or other Referrals:  None Maternal Substance Abuse:  No Significant Maternal Medications:  None Significant Maternal Lab Results:  Group B Strep positive Number of Prenatal Visits:greater than 3 verified prenatal visits   Review of Systems  Constitutional: Negative.   HENT: Negative.    Eyes: Negative.   Respiratory: Negative.    Cardiovascular: Negative.   Gastrointestinal: Negative.   Genitourinary: Negative.   Musculoskeletal: Negative.   Skin: Negative.   Neurological: Negative.   Endo/Heme/Allergies: Negative.   Psychiatric/Behavioral: Negative.       History Dilation: Fingertip Effacement (%): 50 Station: -2 Exam by:: A Metallurgist Blood pressure 128/71, pulse 70, temperature 98.9 F (37.2 C), temperature source Oral, resp. rate 15, height 5\' 7"  (1.702 m), weight 88.9 kg, last menstrual period 09/10/2021, SpO2 98 %. Maternal Exam:  Uterine Assessment: Contraction strength is mild.  Contraction frequency is irregular.  Abdomen: Fundal height is 37 cms.   Estimated fetal weight is 7.5lbs.   Fetal presentation: vertex Introitus: Normal vulva. Vagina is positive for vaginal discharge.  Pelvis: adequate for delivery.   Cervix: Cervix evaluated by digital exam.     Physical Exam Constitutional:      Appearance: Normal appearance.  HENT:     Head: Normocephalic and atraumatic.  Cardiovascular:     Rate and Rhythm: Normal rate and regular rhythm.     Pulses: Normal pulses.     Heart sounds: Normal heart sounds.  Pulmonary:     Effort: Pulmonary effort is normal.     Breath sounds: Normal breath sounds.  Abdominal:     Comments: Gravid. 37 -38 cms fundal height. EFW 7.5 lbs. Total wt gain this pregnanccy is 50 lbs  Genitourinary:    General: Normal vulva.     Vagina: Vaginal discharge present.     Rectum: Normal.     Comments: Leaking clear fluid. Musculoskeletal:        General: Normal range of motion.     Cervical back: Normal range of  motion and neck supple.  Skin:    General: Skin is warm and dry.  Neurological:     General: No focal deficit present.     Mental Status: She is alert and oriented to person, place, and time.  Psychiatric:        Mood and Affect: Mood normal.        Behavior: Behavior normal.     Prenatal labs: ABO, Rh: --/--/A POS Performed at [facility], [address] [place], [place] [address]  [phone]) Antibody: NEG (05/30 0618) Rubella:   RPR: Non Reactive (02/29 1516)  HBsAg:    HIV: Non Reactive (02/29 1516)  GBS: Positive/-- (04/26 1336)   Assessment/Plan: IUP 41; weeks for this G1 P0 Cytotec oral and vaginal placement several hours ago. Category 1  FHTs  Bishop score:3 per RN exam  Will continue with cytotec q 4-6 hours. Discussed the POC for this IOL with the patient. May place [facility] catheter once dilated sufficiently. Pitocin once Bishop score reaches 6. Continuous EFM She plans on an epidural eventually. Routine admission labs, IV infusion, EFM. Labor diet.  [name], CNM  06/23/2022 9:40 AM     [name] 06/23/2022, 9:30 AM

## 2022-06-23 NOTE — Progress Notes (Signed)
Jane Cordova is a 22 y.o. G1P0 at [redacted]w[redacted]d who continues with induction of labor.  Subjective:She is uncomfortable, and would like an epidural. She has vomited several times over the last few hours.   Objective: BP 110/62   Pulse 69   Temp 98.8 F (37.1 C) (Oral)   Resp 18   Ht 5\' 7"  (1.702 m)   Wt 88.9 kg   LMP 09/10/2021 (Approximate)   SpO2 99%   BMI 30.70 kg/m  I/O last 3 completed shifts: In: 722 [I.V.:722] Out: -  No intake/output data recorded.  FHT:  FHR: 120 baseline bpm, variability: moderate,  accelerations:  Present,  decelerations:  Absent UC:   regular, every 4 minutes SVE:   Dilation: 4 Effacement (%): 80 Station: -2 Exam by:: Eunice Blase CNM Bishop score 7 Labs: Lab Results  Component Value Date   WBC 12.0 (H) 06/23/2022   HGB 10.5 (L) 06/23/2022   HCT 32.1 (L) 06/23/2022   MCV 82.7 06/23/2022   PLT 249 06/23/2022    Assessment / Plan: Induction of labor due to postterm,  progressing well on pitocin  Labor:  + cervical change. .She desires epidural  Fetal Wellbeing:  Category I Pain Control:   Will contact anesthesia I/D:  n/a Anticipated MOD:  NSVD  [name], CNM 06/23/2022, 10:05 PM

## 2022-06-23 NOTE — Anesthesia Preprocedure Evaluation (Addendum)
Anesthesia Evaluation  Patient identified by MRN, date of birth, ID band Patient awake    Reviewed: Allergy & Precautions, NPO status , Patient's Chart, lab work & pertinent test results  History of Anesthesia Complications (+) PONV and history of anesthetic complications  Airway Mallampati: II   Neck ROM: Full    Dental   Pulmonary neg pulmonary ROS   Pulmonary exam normal breath sounds clear to auscultation       Cardiovascular Exercise Tolerance: Good negative cardio ROS Normal cardiovascular exam Rhythm:Regular Rate:Normal     Neuro/Psych negative neurological ROS     GI/Hepatic negative GI ROS,,,  Endo/Other  negative endocrine ROS    Renal/GU negative Renal ROS     Musculoskeletal   Abdominal   Peds  Hematology negative hematology ROS (+)   Anesthesia Other Findings   Reproductive/Obstetrics (+) Pregnancy                             Anesthesia Physical Anesthesia Plan  ASA: 2  Anesthesia Plan: Epidural   Post-op Pain Management:    Induction:   PONV Risk Score and Plan: 2 and Treatment may vary due to age or medical condition  Airway Management Planned: Natural Airway  Additional Equipment:   Intra-op Plan:   Post-operative Plan:   Informed Consent: I have reviewed the patients History and Physical, chart, labs and discussed the procedure including the risks, benefits and alternatives for the proposed anesthesia with the patient or authorized representative who has indicated his/her understanding and acceptance.     Dental Advisory Given  Plan Discussed with:   Anesthesia Plan Comments: (Patient reports no bleeding problems and no anticoagulant use.   Patient consented for risks of anesthesia including but not limited to:  - adverse reactions to medications - risk of bleeding, infection and or nerve damage from epidural that could lead to paralysis - risk  of headache or failed epidural - nerve damage due to positioning - that if epidural is used for C-section that there is a chance of epidural failure requiring spinal placement or conversion to GA - damage to heart, brain, lungs, other parts of body or loss of life  Patient voiced understanding.)        Anesthesia Quick Evaluation

## 2022-06-23 NOTE — Progress Notes (Signed)
Jane Cordova is a 22 y.o. G1P0 at [redacted]w[redacted]d by ultrasound admitted for induction of labor due to Post dates. Due date 06/16/2022.  Subjective:Her foley ball fell out several hours ago. She is feeling contractions and reports they are stronger.   Objective: BP 137/84   Pulse 64   Temp 98.8 F (37.1 C) (Oral)   Resp 15   Ht 5\' 7"  (1.702 m)   Wt 88.9 kg   LMP 09/10/2021 (Approximate)   SpO2 98%   BMI 30.70 kg/m  No intake/output data recorded. Total I/O In: 722 [I.V.:722] Out: -   FHT:  FHR: 135 baseline bpm, variability: moderate,  accelerations:  Present,  decelerations:  Absent UC:   irregular, every 5-7 minutes SVE:   Dilation: 3 Effacement (%): 70 Station: -2 Exam by:: A Sherlon Handing RN Bishop score is 7 Labs: Lab Results  Component Value Date   WBC 12.0 (H) 06/23/2022   HGB 10.5 (L) 06/23/2022   HCT 32.1 (L) 06/23/2022   MCV 82.7 06/23/2022   PLT 249 06/23/2022    Assessment / Plan: She has made change and her Bishop score will support pitocin now.Will now augment.  Labor: Progressing on Pitocin, will continue to increase then AROM  Fetal Wellbeing:  Category I Pain Control:  Labor support without medications I/D:  n/a Anticipated MOD:  NSVD Discussed the use of pitocin to increase contraction intensity and frequency.  [name], CNM 06/23/2022, 5:01 PM

## 2022-06-23 NOTE — Anesthesia Procedure Notes (Signed)
Epidural Patient location during procedure: OB Start time: 06/23/2022 7:52 PM End time: 06/23/2022 8:01 PM  Staffing Anesthesiologist: Reed Breech, MD Performed: anesthesiologist   Preanesthetic Checklist Completed: patient identified, IV checked, risks and benefits discussed, surgical consent, monitors and equipment checked, pre-op evaluation and timeout performed  Epidural Patient position: sitting Prep: Betadine Patient monitoring: heart rate, continuous pulse ox and blood pressure Approach: midline Location: L2-L3 Injection technique: LOR air  Needle:  Needle type: Tuohy  Needle gauge: 17 G Needle length: 9 cm Needle insertion depth: 5 cm Catheter at skin depth: 10 cm Test dose: negative and 1.5% lidocaine with Epi 1:200 K  Assessment Sensory level: T4  Additional Notes Straightforward placement without apparent complications. Reason for block:procedure for pain

## 2022-06-23 NOTE — OB Triage Note (Signed)
Patient arrived to LDR 2 for scheduled IOL for postdates. Patient reports good fetal movement, denies leaking of fluid, vaginal bleeding, or contractions.

## 2022-06-24 ENCOUNTER — Encounter: Payer: Self-pay | Admitting: Obstetrics and Gynecology

## 2022-06-24 DIAGNOSIS — Z3A41 41 weeks gestation of pregnancy: Secondary | ICD-10-CM | POA: Diagnosis not present

## 2022-06-24 DIAGNOSIS — O480 Post-term pregnancy: Secondary | ICD-10-CM | POA: Diagnosis not present

## 2022-06-24 MED ORDER — WITCH HAZEL-GLYCERIN EX PADS
MEDICATED_PAD | CUTANEOUS | Status: DC | PRN
  Filled 2022-06-24: qty 100

## 2022-06-24 MED ORDER — DIPHENHYDRAMINE HCL 25 MG PO CAPS: 25.0000 mg | ORAL_CAPSULE | Freq: Four times a day (QID) | ORAL | Status: DC | PRN

## 2022-06-24 MED ORDER — OXYTOCIN-SODIUM CHLORIDE 30-0.9 UT/500ML-% IV SOLN: 2.5000 [IU]/h | INTRAVENOUS | Status: DC | PRN

## 2022-06-24 MED ORDER — DOCUSATE SODIUM 100 MG PO CAPS
100.0000 mg | ORAL_CAPSULE | Freq: Two times a day (BID) | ORAL | Status: DC
  Administered 2022-06-24 – 2022-06-25 (×2): 100 mg via ORAL
  Filled 2022-06-24 (×2): qty 1

## 2022-06-24 MED ORDER — BENZOCAINE-MENTHOL 20-0.5 % EX AERO
1.0000 | INHALATION_SPRAY | CUTANEOUS | Status: DC | PRN
  Filled 2022-06-24: qty 56

## 2022-06-24 MED ORDER — SIMETHICONE 80 MG PO CHEW: 80.0000 mg | CHEWABLE_TABLET | ORAL | Status: DC | PRN

## 2022-06-24 MED ORDER — COCONUT OIL OIL
1.0000 | TOPICAL_OIL | Status: DC | PRN
  Filled 2022-06-24: qty 7.5

## 2022-06-24 MED ORDER — PRENATAL MULTIVITAMIN CH
1.0000 | ORAL_TABLET | Freq: Every day | ORAL | Status: DC
  Administered 2022-06-25: 1 via ORAL
  Filled 2022-06-24: qty 1

## 2022-06-24 MED ORDER — ACETAMINOPHEN 325 MG PO TABS: 650.0000 mg | ORAL_TABLET | ORAL | Status: DC | PRN

## 2022-06-24 MED ORDER — IBUPROFEN 600 MG PO TABS
600.0000 mg | ORAL_TABLET | Freq: Four times a day (QID) | ORAL | Status: DC
  Administered 2022-06-24 – 2022-06-25 (×4): 600 mg via ORAL
  Filled 2022-06-24 (×4): qty 1

## 2022-06-24 MED ORDER — TETANUS-DIPHTH-ACELL PERTUSSIS 5-2.5-18.5 LF-MCG/0.5 IM SUSY: 0.5000 mL | PREFILLED_SYRINGE | Freq: Once | INTRAMUSCULAR | Status: DC

## 2022-06-24 NOTE — Lactation Note (Addendum)
This note was copied from a baby's chart. Lactation Consultation Note  Patient Name: Jane Cordova Date: 06/24/2022 Age:21 hours Reason for consult: L&D Initial assessment;Primapara;Term   Maternal Data Has patient been taught Hand Expression?: Yes Does the patient have breastfeeding experience prior to this delivery?: No  Feeding Mother's Current Feeding Choice: Breast Milk BAby fussy in bassinet, rooting, Assisted mom with latching baby to right breast in cradle hold, after hand expressing colostrum, baby latches easily, needs occ stimulation to suck and not fall asleep, nursed well x 10 min with some swallows noted    LATCH Score Latch: Grasps breast easily, tongue down, lips flanged, rhythmical sucking.  Audible Swallowing: A few with stimulation  Type of Nipple: Everted at rest and after stimulation  Comfort (Breast/Nipple): Soft / non-tender  Hold (Positioning): Assistance needed to correctly position infant at breast and maintain latch.  LATCH Score: 8   Lactation Tools Discussed/Used    Interventions Interventions: Breast feeding basics reviewed;Assisted with latch;Skin to skin;Hand express;Adjust position;Breast compression;Support pillows;Education  Discharge Pump: Personal WIC Program: Yes  Consult Status Consult Status: Follow-up from L&D    Dyann Kief 06/24/2022, 12:50 PM

## 2022-06-24 NOTE — Progress Notes (Signed)
Jane Cordova is a 22 y.o. G1P0 who began pushing several hours ago.   Subjective:Her mother and partner are both present and supportive. She is tearful; feeling much rectal pressure and has been pushing in numerous positions for a while.   Objective: BP 132/68   Pulse 83   Temp 98 F (36.7 C) (Oral)   Resp 20   Ht 5\' 7"  (1.702 m)   Wt 88.9 kg   LMP 09/10/2021 (Approximate)   SpO2 92%   BMI 30.70 kg/m  I/O last 3 completed shifts: In: 1857.1 [P.O.:600; I.V.:1042.9; Other:114.2; IV Piggyback:100] Out: 2175 [Urine:2175] No intake/output data recorded.  FHT:  FHR: 145-150 bpm, variability: some difficulty tracing FHTs with pateint pushing in sidelying. Moderate variability noted,  accelerations:  Present,  decelerations:  Present some early decels with pushing and variables. UC:   regular, every 2-3 minutes SVE:   Dilation: 10 Effacement (%): 100 Station: Plus 1 Exam by:: [place] Guptill RN  Labs: Lab Results  Component Value Date   WBC 12.0 (H) 06/23/2022   HGB 10.5 (L) 06/23/2022   HCT 32.1 (L) 06/23/2022   MCV 82.7 06/23/2022   PLT 249 06/23/2022    Assessment / Plan: Secondstage with pushing in progress.  LFetal Wellbeing:  Category II Pain Control:  Epidural I/D:  n/a Anticipated MOD:  NSVD Will continue with pushing efforts. Verbal coaching provided by the RN and CNM, as well as family. Anticipate SVD. [name], CNM 06/24/2022, 7:51 AM

## 2022-06-24 NOTE — Discharge Summary (Signed)
Postpartum Discharge Summary  Date of Service updated***     Patient Name: Jane Cordova DOB: [birth year] MRN: [id number]  Date of admission: 06/23/2022 Delivery date:06/24/2022  Delivering provider: Glenetta Borg  Date of discharge: *** Admitting diagnosis: Post term pregnancy, antepartum condition or complication [O48.0] Intrauterine pregnancy: [redacted]w[redacted]d     Secondary diagnosis:  Principal Problem:   Post term pregnancy, antepartum condition or complication  Additional problems: none    Discharge diagnosis: Term Pregnancy Delivered                                              Post partum procedures:{Postpartum procedures:23558} Augmentation: Pitocin, Cytotec, and IP Foley Complications: None  Hospital course: Onset of Labor With Vaginal Delivery      22 y.o. yo G1P1001 at [redacted]w[redacted]d was admitted for induction on 06/23/2022. Labor course was uncomplicated.  Membrane Rupture Time/Date: 2:30 AM ,06/24/2022   Delivery Method:Vaginal, Spontaneous  Episiotomy: None  Lacerations:  1st degree;Vaginal  Patient had a postpartum course complicated by ***.  She is ambulating, tolerating a regular diet, passing flatus, and urinating well. Patient is discharged home in stable condition on 06/24/22.  Newborn Data: Birth date:06/24/2022  Birth time:9:43 AM  Gender:Female  Living status:Living  Apgars:8 ,9  Weight:   Magnesium Sulfate received: No BMZ received: No Rhophylac:N/A MMR:{MMR:30440033} T-DaP:Given prenatally Flu: No Transfusion:{Transfusion received:30440034}  Physical exam  Vitals:   06/24/22 0950 06/24/22 0955 06/24/22 1001 06/24/22 1015  BP: 134/66  90/69 95/65  Pulse: 72  (!) 132 85  Resp:      Temp:      TempSrc:      SpO2: 98% 100%    Weight:      Height:       General: {Exam; general:21111117} Lochia: {Desc; appropriate/inappropriate:30686::"appropriate"} Uterine Fundus: {Desc; firm/soft:30687} Incision: {Exam; incision:21111123} DVT Evaluation: {Exam;  dvt:2111122} Labs: Lab Results  Component Value Date   WBC 12.0 (H) 06/23/2022   HGB 10.5 (L) 06/23/2022   HCT 32.1 (L) 06/23/2022   MCV 82.7 06/23/2022   PLT 249 06/23/2022       No data to display         Edinburgh Score:    12/24/2021   11:27 AM  Edinburgh Postnatal Depression Scale Screening Tool  I have been able to laugh and see the funny side of things. 0  I have looked forward with enjoyment to things. 0  I have blamed myself unnecessarily when things went wrong. 0  I have been anxious or worried for no good reason. 2  I have felt scared or panicky for no good reason. 0  Things have been getting on top of me. 0  I have been so unhappy that I have had difficulty sleeping. 0  I have felt sad or miserable. 1  I have been so unhappy that I have been crying. 1  The thought of harming myself has occurred to me. 0  Edinburgh Postnatal Depression Scale Total 4      After visit meds:  Allergies as of 06/24/2022   No Known Allergies   Med Rec must be completed prior to using this [facility]***        Discharge home in stable condition Infant Feeding: {Baby feeding:23562} Infant Disposition:{CHL IP OB HOME WITH WUJWJX:91478} Discharge instruction: per After Visit Summary and Postpartum booklet. Activity: Advance as tolerated.  Pelvic rest for 6 weeks.  Diet: {OB diet:21111121} Anticipated Birth Control: {Birth Control:23956} Postpartum Appointment:{Outpatient follow up:23559} Additional Postpartum F/U: {PP Procedure:23957} Future Appointments:No future appointments. Follow up Visit:  Follow-up Information     Glenetta Borg, CNM. Schedule an appointment as soon as possible for a visit.   Specialty: Obstetrics Why: 2 weeks for video visit 6 week office visit Contact information: [address] [place] [address] [phone]

## 2022-06-25 MED ORDER — DOCUSATE SODIUM 100 MG PO CAPS: 100.0000 mg | ORAL_CAPSULE | Freq: Two times a day (BID) | ORAL | 0 refills | Status: AC

## 2022-06-25 MED ORDER — IBUPROFEN 600 MG PO TABS: 600.0000 mg | ORAL_TABLET | Freq: Four times a day (QID) | ORAL | 0 refills | Status: AC | PRN

## 2022-06-25 MED ORDER — WITCH HAZEL-GLYCERIN EX PADS: MEDICATED_PAD | CUTANEOUS | 12 refills | Status: AC | PRN

## 2022-06-25 MED ORDER — ACETAMINOPHEN 325 MG PO TABS: 650.0000 mg | ORAL_TABLET | ORAL | Status: AC | PRN

## 2022-06-25 NOTE — Progress Notes (Signed)
Patient discharged home with family.  Discharge instructions, when to follow up, and prescriptions reviewed with patient.  Patient verbalized understanding. Patient will be escorted out by auxiliary.   

## 2022-06-25 NOTE — Lactation Note (Signed)
This note was copied from a baby's chart. Lactation Consultation Note  Patient Name: Jane Cordova Date: 06/25/2022 Age:21 hours Reason for consult: Follow-up assessment  Lactation Rounds: LC to the room for a visit. Mother and baby are positioned in cross cradle on the right but baby is slipping off the breast. Encouraged a deeper latch by unwrapping baby at the breast and stroking nipple nose to chin and waiting for a wide gape. Baby is able to latch easily but often is tucking his chin. LC encouraged trying football position on the same breast. Baby fed very well with many swallows and rhythmic pattern. Taught how to position baby at breast height, with nose in line with nipple.  LC reviewed and encouraged feeding on demand and with cues. If baby is not cueing we encourage hand expression and spoon feed to wake baby, Baby should be eating at least 8 or more good feeds in 24hrs. Reviewed diaper counts for days of life and when to call Peds with questions. Reviewed "understanding Postpartum and Newborn care " booklet at bedside. Reviewed outpatient Lactation number and resources, encouraged downloading an app for tracking feeds and diapers. Reviewed pacifier, pumping, and bottles are not encouraged until breastfeeding is established and going well in the first 4 weeks as long as baby is latching. Parents stated understanding with all teaching.   Maternal Data Has patient been taught Hand Expression?: Yes Does the patient have breastfeeding experience prior to this delivery?: No  Feeding Mother's Current Feeding Choice: Breast Milk  LATCH Score Latch: Repeated attempts needed to sustain latch, nipple held in mouth throughout feeding, stimulation needed to elicit sucking reflex.  Audible Swallowing: Spontaneous and intermittent  Type of Nipple: Everted at rest and after stimulation (shorter)  Comfort (Breast/Nipple): Filling, red/small blisters or bruises, mild/mod  discomfort  Hold (Positioning): Assistance needed to correctly position infant at breast and maintain latch.  LATCH Score: 7    Interventions Interventions: Breast feeding basics reviewed;Assisted with latch;Hand express;Breast compression;Support pillows;Position options;Comfort gels;Education  Discharge Discharge Education: Engorgement and breast care;Warning signs for feeding baby (has [employer]) Pump: Personal (has a Spectra at home)  Consult Status Consult Status: PRN    Ananias Kolander D Daemyn Gariepy 06/25/2022, 11:51 AM

## 2022-06-25 NOTE — Discharge Instructions (Signed)
Discharge instructions:   Call office if you have any of the following: headache, visual changes, fever >101 F, chills, breast concerns, excessive vaginal bleeding, leg pain or redness, depression or any other concerns.   Activity: Do not lift > 20 lbs for 6 weeks.  No intercourse or tampons for 6 weeks.  No driving for 1-2 weeks.   Call your doctor for increased pain or vaginal bleeding, temperature above 101.0, depression, or concerns.  No strenuous activity or heavy lifting for 6 weeks.  No intercourse, tampons, douching, or enemas for 6 weeks.  Tub baths and showers are ok.  No driving for 2 weeks or while taking pain medications.  Continue prenatal vitamin and iron.  Increase calories and fluids while breastfeeding.  For concerns about your baby please call the pediatrician For breastfeeding issues please remember to call our lactation consultants. 

## 2022-06-25 NOTE — Anesthesia Postprocedure Evaluation (Signed)
Anesthesia Post Note  Patient: Jane Cordova  Procedure(s) Performed: AN AD HOC LABOR EPIDURAL  Patient location during evaluation: Mother Baby Anesthesia Type: Epidural Level of consciousness: awake and alert Pain management: pain level controlled Vital Signs Assessment: post-procedure vital signs reviewed and stable Respiratory status: spontaneous breathing, nonlabored ventilation and respiratory function stable Cardiovascular status: stable Postop Assessment: no headache, no backache and epidural receding Anesthetic complications: no   No notable events documented.   Last Vitals:  Vitals:   06/24/22 2336 06/25/22 0358  BP: 111/62 125/86  Pulse: 75 75  Resp: 20 20  Temp: 36.9 C (!) 36.4 C  SpO2: 100% 95%    Last Pain:  Vitals:   06/25/22 5784  TempSrc:   PainSc: 0-No pain                 Corinda Gubler

## 2022-06-25 NOTE — Progress Notes (Signed)
CSW spoke with MOB and gave her guidance on how to apply for medicaid for the baby. Patient was also given steps on how to obtain birth certificate and social securit card.

## 2022-07-11 ENCOUNTER — Telehealth (INDEPENDENT_AMBULATORY_CARE_PROVIDER_SITE_OTHER): Payer: BC Managed Care – PPO | Admitting: Obstetrics

## 2022-07-11 DIAGNOSIS — Z3400 Encounter for supervision of normal first pregnancy, unspecified trimester: Secondary | ICD-10-CM

## 2022-07-11 NOTE — Progress Notes (Signed)
Postpartum Visit  Chief Complaint:  Chief Complaint  Patient presents with   Postpartum Care    History of Present Illness: Patient is a 22 y.o. G1P1001 having a virtual  a 2 week postpartum visit. The patient was identified initially via computor and seen on the monitor. With no audio , the visit was switched to a phone virtual visit.  Date of delivery: 06/24/2022 Type of delivery: Vaginal delivery - Vacuum or forceps assisted  no Episiotomy No.  Laceration: yes 1st degree Pregnancy or labor problems:  no Any problems since the delivery:  no she denies physical or emotional problems. Doing well. She is formula feeding.  Newborn Details:  SINGLETON :  1. Baby's name: Waylon. Birth weight: 3000gms Maternal Details:  Breast Feeding:  no Post partum depression/anxiety noted:  no [place] Post-Partum Depression Score:  3    Past Medical History:  Diagnosis Date   No pertinent past medical history     Past Surgical History:  Procedure Laterality Date   OTHER SURGICAL HISTORY     Ligament Reconstruction on Left Knee    Prior to Admission medications   Medication Sig Start Date End Date Taking? Authorizing Provider  acetaminophen (TYLENOL) 325 MG tablet Take 2 tablets (650 mg total) by mouth every 4 (four) hours as needed (for pain scale < 4). 06/25/22  Yes Dominic, Courtney Heys, CNM  docusate sodium (COLACE) 100 MG capsule Take 1 capsule (100 mg total) by mouth 2 (two) times daily. 06/25/22  Yes Dominic, Courtney Heys, CNM  ibuprofen (ADVIL) 600 MG tablet Take 1 tablet (600 mg total) by mouth every 6 (six) hours as needed. 06/25/22  Yes Dominic, Courtney Heys, CNM  Prenatal Vit-Fe Fumarate-FA (PRENATAL PO) Take by mouth.   Yes [provider]  witch hazel-glycerin (TUCKS) pad Apply topically as needed for itching. 06/25/22  Yes Dominic, Courtney Heys, CNM    No Known Allergies   Social History   Socioeconomic History   Marital status: Single    Spouse name: Not on file    Number of children: 0   Years of education: 13   Highest education level: High school graduate  Occupational History   Occupation: Stay At Home  Tobacco Use   Smoking status: Never   Smokeless tobacco: Never  Vaping Use   Vaping Use: Former   Substances: Nicotine  Substance and Sexual Activity   Alcohol use: Not Currently   Drug use: Not Currently   Sexual activity: Yes    Partners: Male    Birth control/protection: None  Other Topics Concern   Not on file  Social History Narrative   Not on file   Social Determinants of Health   Financial Resource Strain: Low Risk  (12/24/2021)   Overall Financial Resource Strain (CARDIA)    Difficulty of Paying Living Expenses: Not very hard  Food Insecurity: No Food Insecurity (06/23/2022)   Hunger Vital Sign    Worried About Running Out of Food in the Last Year: Never true    Ran Out of Food in the Last Year: Never true  Transportation Needs: No Transportation Needs (06/23/2022)   PRAPARE - Administrator, Civil Service (Medical): No    Lack of Transportation (Non-Medical): No  Physical Activity: Insufficiently Active (12/24/2021)   Exercise Vital Sign    Days of Exercise per Week: 1 day    Minutes of Exercise per Session: 10 min  Stress: No Stress Concern Present (12/24/2021)   [employer] of Occupational  Health - Occupational Stress Questionnaire    Feeling of Stress : Only a little  Social Connections: Moderately Integrated (12/24/2021)   Social Connection and Isolation Panel [NHANES]    Frequency of Communication with Friends and Family: More than three times a week    Frequency of Social Gatherings with Friends and Family: Twice a week    Attends Religious Services: More than 4 times per year    Active Member of [employer] or Organizations: No    Attends Banker Meetings: Never    Marital Status: Living with partner  Intimate Partner Violence: Not At Risk (06/23/2022)   Humiliation, Afraid, Rape, and Kick  questionnaire    Fear of Current or Ex-Partner: No    Emotionally Abused: No    Physically Abused: No    Sexually Abused: No    Family History  Problem Relation Age of Onset   Healthy Father    Healthy Brother    Diabetes Maternal Grandmother    Heart Problems Maternal Grandmother    Diabetes Maternal Grandfather    Lung cancer Paternal Grandmother    Skin cancer Paternal Grandmother    Skin cancer Paternal Grandfather    Breast cancer Maternal Great-grandmother 67   Multiple myeloma Maternal Great-grandmother 58    ROS   Physical Exam LMP 09/10/2021 (Approximate)   OBGyn Exam - deferred as this is a virtual visit.  Female Chaperone present during breast and/or pelvic exam.  Assessment: 22 y.o. G1P1001 presenting for 2week postpartum visit  Plan: Problem List Items Addressed This Visit   None    1) Contraception Education given regarding options for contraception, including  a discussion that includes Nexplanon, OCPs, IUDs and barriers. Dahlya has felt that OCPs in the past made her moody. She may opt for condom use.  .I have recommended that she refrain from IC until after her 6 week PP visit.    Patient underwent screening for postpartum depression with no concerns noted.   Follow up 1 month for her PP physical   [name], CNM  07/11/2022 1:30 PM   07/11/2022 1:25 PM

## 2022-07-27 ENCOUNTER — Telehealth: Payer: Self-pay

## 2022-07-27 NOTE — Telephone Encounter (Signed)

## 2022-08-08 ENCOUNTER — Ambulatory Visit (INDEPENDENT_AMBULATORY_CARE_PROVIDER_SITE_OTHER): Payer: BC Managed Care – PPO | Admitting: Obstetrics

## 2022-08-08 ENCOUNTER — Encounter: Payer: Self-pay | Admitting: Obstetrics

## 2022-08-08 VITALS — BP 121/77 | HR 90 | Wt 180.8 lb

## 2022-08-08 DIAGNOSIS — Z3202 Encounter for pregnancy test, result negative: Secondary | ICD-10-CM

## 2022-08-08 DIAGNOSIS — Z3009 Encounter for other general counseling and advice on contraception: Secondary | ICD-10-CM

## 2022-08-08 DIAGNOSIS — Z30430 Encounter for insertion of intrauterine contraceptive device: Secondary | ICD-10-CM

## 2022-08-08 LAB — POCT URINE PREGNANCY: Preg Test, Ur: NEGATIVE

## 2022-08-08 MED ORDER — NORETHIN ACE-ETH ESTRAD-FE 1-20 MG-MCG PO TABS: 1.0000 | ORAL_TABLET | Freq: Every day | ORAL | 11 refills | Status: AC

## 2022-08-08 MED ORDER — MISOPROSTOL 200 MCG PO TABS: 200.0000 ug | ORAL_TABLET | Freq: Once | ORAL | 0 refills | Status: AC

## 2022-08-08 NOTE — Progress Notes (Signed)
Postpartum Visit  Chief Complaint:  Chief Complaint  Patient presents with   Postpartum Care    History of Present Illness: Patient is a 22 y.o. G1P1001 presents for postpartum visit.  Date of delivery: 06/24/2022 Type of delivery: Vaginal delivery - Vacuum or forceps assisted  no Episiotomy No.  Laceration: no  Pregnancy or labor problems:  first degree Any problems since the delivery:  no  Newborn Details:  SINGLETON :  1. Baby's name: female. Birth weight: 3200 gms Maternal Details:  Breast Feeding:  no Post partum depression/anxiety noted:  no Edinburgh Post-Partum Depression Score:  3  Date of last PAP: NA   Past Medical History:  Diagnosis Date   No pertinent past medical history     Past Surgical History:  Procedure Laterality Date   OTHER SURGICAL HISTORY     Ligament Reconstruction on Left Knee    Prior to Admission medications   Medication Sig Start Date End Date Taking? Authorizing Provider  acetaminophen (TYLENOL) 325 MG tablet Take 2 tablets (650 mg total) by mouth every 4 (four) hours as needed (for pain scale < 4). 06/25/22   Dominic, Courtney Heys, CNM  docusate sodium (COLACE) 100 MG capsule Take 1 capsule (100 mg total) by mouth 2 (two) times daily. 06/25/22   Dominic, Courtney Heys, CNM  ibuprofen (ADVIL) 600 MG tablet Take 1 tablet (600 mg total) by mouth every 6 (six) hours as needed. 06/25/22   Dominic, Courtney Heys, CNM  Prenatal Vit-Fe Fumarate-FA (PRENATAL PO) Take by mouth.    [provider]  witch hazel-glycerin (TUCKS) pad Apply topically as needed for itching. 06/25/22   Dominic, Courtney Heys, CNM    No Known Allergies   Social History   Socioeconomic History   Marital status: Single    Spouse name: Not on file   Number of children: 0   Years of education: 13   Highest education level: High school graduate  Occupational History   Occupation: Stay At Home  Tobacco Use   Smoking status: Never   Smokeless tobacco: Never  Vaping Use    Vaping status: Former   Substances: Nicotine  Substance and Sexual Activity   Alcohol use: Not Currently   Drug use: Not Currently   Sexual activity: Yes    Partners: Male    Birth control/protection: None  Other Topics Concern   Not on file  Social History Narrative   Not on file   Social Determinants of Health   Financial Resource Strain: Low Risk  (12/24/2021)   Overall Financial Resource Strain (CARDIA)    Difficulty of Paying Living Expenses: Not very hard  Food Insecurity: No Food Insecurity (06/23/2022)   Hunger Vital Sign    Worried About Running Out of Food in the Last Year: Never true    Ran Out of Food in the Last Year: Never true  Transportation Needs: No Transportation Needs (06/23/2022)   PRAPARE - Administrator, Civil Service (Medical): No    Lack of Transportation (Non-Medical): No  Physical Activity: Insufficiently Active (12/24/2021)   Exercise Vital Sign    Days of Exercise per Week: 1 day    Minutes of Exercise per Session: 10 min  Stress: No Stress Concern Present (12/24/2021)   [employer] of Occupational Health - Occupational Stress Questionnaire    Feeling of Stress : Only a little  Social Connections: Moderately Integrated (12/24/2021)   Social Connection and Isolation Panel [NHANES]    Frequency of Communication with  Friends and Family: More than three times a week    Frequency of Social Gatherings with Friends and Family: Twice a week    Attends Religious Services: More than 4 times per year    Active Member of [employer] or Organizations: No    Attends Banker Meetings: Never    Marital Status: Living with partner  Intimate Partner Violence: Not At Risk (06/23/2022)   Humiliation, Afraid, Rape, and Kick questionnaire    Fear of Current or Ex-Partner: No    Emotionally Abused: No    Physically Abused: No    Sexually Abused: No    Family History  Problem Relation Age of Onset   Healthy Father    Healthy Brother     Diabetes Maternal Grandmother    Heart Problems Maternal Grandmother    Diabetes Maternal Grandfather    Lung cancer Paternal Grandmother    Skin cancer Paternal Grandmother    Skin cancer Paternal Grandfather    Breast cancer Maternal Great-grandmother 30   Multiple myeloma Maternal Great-grandmother 67    Review of Systems  Constitutional: Negative.   HENT: Negative.    Eyes: Negative.   Respiratory: Negative.    Cardiovascular: Negative.   Genitourinary: Negative.   Musculoskeletal: Negative.   Skin: Negative.   Psychiatric/Behavioral: Negative.    All other systems reviewed and are negative.    Physical Exam BP 121/77   Pulse 90   Wt 180 lb 12.8 oz (82 kg)   LMP 08/01/2022 (Approximate)   Breastfeeding No   BMI 28.32 kg/m   Physical Exam Constitutional:      Appearance: Normal appearance.  Genitourinary:     Vulva and rectum normal.     Genitourinary Comments: Started on her menses. Uterus is anteverted, midline, mobile, no CMT First degree healed well.  HENT:     Head: Normocephalic and atraumatic.  Cardiovascular:     Rate and Rhythm: Normal rate and regular rhythm.  Pulmonary:     Effort: Pulmonary effort is normal.     Breath sounds: Normal breath sounds.  Abdominal:     Palpations: Abdomen is soft.  Musculoskeletal:        General: Normal range of motion.     Cervical back: Normal range of motion and neck supple.  Neurological:     General: No focal deficit present.     Mental Status: She is alert and oriented to person, place, and time.  Skin:    General: Skin is warm and dry.  Psychiatric:        Mood and Affect: Mood normal.        Behavior: Behavior normal.      Female Chaperone present during breast and/or pelvic exam.  Assessment: 22 y.o. G1P1001 presenting for 6 week postpartum visit  Plan: Problem List Items Addressed This Visit   None Visit Diagnoses     Encounter for counseling regarding contraception    -  Primary   Relevant  Orders   POCT urine pregnancy        1) Contraception Education given regarding options for contraception, including barrier methods, IUD placement, oral contraceptives. She has considered an IUD, but is less comfortable about having something iartificial in her body Opting for OCPs today.  2)  Pap - ASCCP guidelines and rational discussed.  Patient opts for annual screening interval starting next year at age 60  3) Patient underwent screening for postpartum depression with no concerns noted.  4) Follow up 1 year  for routine annual exam  [name], CNM  08/08/2022 1:19 PM   08/08/2022 1:19 PM

## 2022-08-23 ENCOUNTER — Telehealth: Payer: BC Managed Care – PPO | Admitting: Physician Assistant

## 2022-08-23 DIAGNOSIS — N941 Unspecified dyspareunia: Secondary | ICD-10-CM

## 2022-08-23 NOTE — Progress Notes (Signed)
Because of pain with intercourse after recent pregnancy and IUD placement, I feel your condition warrants further evaluation and I recommend that you be seen in a face to face visit with your OBGYN or PCP   NOTE: There will be NO CHARGE for this eVisit   If you are having a true medical emergency please call 911.      For an urgent face to face visit, [place] has eight urgent care centers for your convenience:   NEW!! [facility] Health Urgent Care Center at [facility] Get Driving Directions [phone] [address], [address] [place], [address]    [facility] Health Urgent Care Center at [facility] Get Driving Directions [phone] [address] [place], [place] [address]   [facility] Health Urgent Care Center [facility]) Get Driving Directions [phone] [address] [place], [place] [address]  [facility] Health Urgent Care Center [facility]) Get Driving Directions [phone] [address] [place],  [place]  [address]  [facility] Health Urgent Care Center [facility] - at [employer]  [phone] 224 154 [address] [place],  [place] [address]   [facility] Health Urgent Care at [facility] Get Driving Directions [phone] [address] [place], [place] [address]   [facility] Health Urgent Care at [facility] Get Driving Directions  [phone] [address] [place], [place] [address]   [facility] Health Urgent Care at [facility] Treatment Directions [phone] [address] [place], [place] [address]  Your MyChart E-visit questionnaire answers were reviewed by a board certified advanced clinical practitioner to complete your personal care plan based on your specific symptoms.  Thank you for using e-Visits.

## 2022-11-25 ENCOUNTER — Other Ambulatory Visit: Payer: Self-pay | Admitting: Medical Genetics

## 2022-11-25 DIAGNOSIS — Z006 Encounter for examination for normal comparison and control in clinical research program: Secondary | ICD-10-CM

## 2022-12-09 ENCOUNTER — Telehealth: Payer: BC Managed Care – PPO | Admitting: Physician Assistant

## 2022-12-09 DIAGNOSIS — R051 Acute cough: Secondary | ICD-10-CM | POA: Diagnosis not present

## 2022-12-09 DIAGNOSIS — J040 Acute laryngitis: Secondary | ICD-10-CM

## 2022-12-09 DIAGNOSIS — J029 Acute pharyngitis, unspecified: Secondary | ICD-10-CM

## 2022-12-09 MED ORDER — LIDOCAINE VISCOUS HCL 2 % MT SOLN: OROMUCOSAL | 0 refills | Status: AC

## 2022-12-09 MED ORDER — PSEUDOEPH-BROMPHEN-DM 30-2-10 MG/5ML PO SYRP: 5.0000 mL | ORAL_SOLUTION | Freq: Four times a day (QID) | ORAL | 0 refills | Status: AC | PRN

## 2022-12-09 NOTE — Progress Notes (Signed)
E-Visit for Sore Throat  We are sorry that you are not feeling well.  Here is how we plan to help!  Your symptoms indicate a likely viral infection (Pharyngitis).   Pharyngitis is inflammation in the back of the throat which can cause a sore throat, scratchiness and sometimes difficulty swallowing.   Pharyngitis is typically caused by a respiratory virus and will just run its course.  Please keep in mind that your symptoms could last up to 10 days.  For throat pain, we recommend over the counter oral pain relief medications such as acetaminophen or aspirin, or anti-inflammatory medications such as ibuprofen or naproxen sodium.  Topical treatments such as oral throat lozenges or sprays may be used as needed.  I will prescribe Viscous Lidocaine 2% Swallow 5-28mL every 6 hours as needed for sore throat. I will also prescribe Bromfed DM cough syrup to use 5mL every 6 hours as needed for coughing and congestion. You can do salt water gargles and voice rest for the laryngitis (hoarseness). Avoid close contact with loved ones, especially the very young and elderly.  Remember to wash your hands thoroughly throughout the day as this is the number one way to prevent the spread of infection and wipe down door knobs and counters with disinfectant.  After careful review of your answers, I would not recommend an antibiotic for your condition.  Antibiotics should not be used to treat conditions that we suspect are caused by viruses like the virus that causes the common cold or flu. However, some people can have Strep with atypical symptoms. You may need formal testing in clinic or office to confirm if your symptoms continue or worsen.  Providers prescribe antibiotics to treat infections caused by bacteria. Antibiotics are very powerful in treating bacterial infections when they are used properly.  To maintain their effectiveness, they should be used only when necessary.  Overuse of antibiotics has resulted in the  development of super bugs that are resistant to treatment!    Home Care: Only take medications as instructed by your medical team. Do not drink alcohol while taking these medications. A steam or ultrasonic humidifier can help congestion.  You can place a towel over your head and breathe in the steam from hot water coming from a faucet. Avoid close contacts especially the very young and the elderly. Cover your mouth when you cough or sneeze. Always remember to wash your hands.  Get Help Right Away If: You develop worsening fever or throat pain. You develop a severe head ache or visual changes. Your symptoms persist after you have completed your treatment plan.  Make sure you Understand these instructions. Will watch your condition. Will get help right away if you are not doing well or get worse.   Thank you for choosing an e-visit.  Your e-visit answers were reviewed by a board certified advanced clinical practitioner to complete your personal care plan. Depending upon the condition, your plan could have included both over the counter or prescription medications.  Please review your pharmacy choice. Make sure the pharmacy is open so you can pick up prescription now. If there is a problem, you may contact your provider through [employer] and have the prescription routed to another pharmacy.  Your safety is important to [place]. If you have drug allergies check your prescription carefully.   For the next 24 hours you can use MyChart to ask questions about today's visit, request a non-urgent call back, or ask for a work or school  excuse. You will get an email in the next two days asking about your experience. I hope that your e-visit has been valuable and will speed your recovery.   I have spent 5 minutes in review of e-visit questionnaire, review and updating patient chart, medical decision making and response to patient.   Margaretann Loveless, PA-C

## 2022-12-11 DIAGNOSIS — J060 Acute laryngopharyngitis: Secondary | ICD-10-CM | POA: Diagnosis not present

## 2023-01-31 DIAGNOSIS — M25562 Pain in left knee: Secondary | ICD-10-CM | POA: Diagnosis not present

## 2023-02-01 DIAGNOSIS — M25562 Pain in left knee: Secondary | ICD-10-CM | POA: Diagnosis not present

## 2023-04-11 DIAGNOSIS — Z1331 Encounter for screening for depression: Secondary | ICD-10-CM | POA: Diagnosis not present

## 2023-04-11 DIAGNOSIS — F419 Anxiety disorder, unspecified: Secondary | ICD-10-CM | POA: Diagnosis not present

## 2023-05-03 DIAGNOSIS — F419 Anxiety disorder, unspecified: Secondary | ICD-10-CM | POA: Diagnosis not present

## 2023-09-18 DIAGNOSIS — J060 Acute laryngopharyngitis: Secondary | ICD-10-CM | POA: Diagnosis not present

## 2023-09-18 DIAGNOSIS — R070 Pain in throat: Secondary | ICD-10-CM | POA: Diagnosis not present

## 2023-09-18 DIAGNOSIS — R0981 Nasal congestion: Secondary | ICD-10-CM | POA: Diagnosis not present

## 2023-09-18 DIAGNOSIS — R0982 Postnasal drip: Secondary | ICD-10-CM | POA: Diagnosis not present

## 2023-11-08 ENCOUNTER — Other Ambulatory Visit: Payer: Self-pay | Admitting: Medical Genetics

## 2023-11-08 DIAGNOSIS — Z006 Encounter for examination for normal comparison and control in clinical research program: Secondary | ICD-10-CM

## 2024-01-08 ENCOUNTER — Ambulatory Visit

## 2024-01-08 VITALS — BP 127/78 | HR 87 | Wt 155.0 lb

## 2024-01-08 DIAGNOSIS — Z3687 Encounter for antenatal screening for uncertain dates: Secondary | ICD-10-CM

## 2024-01-08 DIAGNOSIS — Z3201 Encounter for pregnancy test, result positive: Secondary | ICD-10-CM | POA: Diagnosis not present

## 2024-01-08 DIAGNOSIS — N912 Amenorrhea, unspecified: Secondary | ICD-10-CM

## 2024-01-08 LAB — POCT URINE PREGNANCY: Preg Test, Ur: POSITIVE — AB

## 2024-01-08 NOTE — Progress Notes (Signed)
° ° °  NURSE VISIT NOTE  Subjective:    Patient ID: ZISSEL BIEDERMAN, female    DOB: 11-Apr-2000, 23 y.o.   MRN: [id number]  HPI  Patient is a 23 y.o. G69P1001 female who presents for evaluation of amenorrhea. She believes she could be pregnant. Pregnancy is desired. Sexual Activity: single partner, contraception: none. Current symptoms also include: breast tenderness, fatigue, frequent urination, and positive home pregnancy test. Last period was normal.    Objective:    There were no vitals taken for this visit.  Lab Review  Results for orders placed or performed in visit on 01/08/24  POCT urine pregnancy  Result Value Ref Range   Preg Test, Ur Positive (A) Negative    Assessment:   1. Absence of menstruation   2. Encounter for antenatal screening for uncertain dates     Plan:   Pregnancy Test: Positive  Estimated Date of Delivery: None noted. BP Cuff Measurement taken. Cuff Size Adult Small Encouraged well-balanced diet, plenty of rest when needed, pre-natal vitamins daily and walking for exercise.  Discussed self-help for nausea, avoiding OTC medications until consulting provider or pharmacist, other than Tylenol  as needed, minimal caffeine (1-2 cups daily) and avoiding alcohol.   She will schedule her nurse visit @ 7-[redacted] wks pregnant, u/s for dating @10  wk, and NOB visit at [redacted] wk pregnant.    Feel free to call with any questions.     Rollo JINNY Maxin, CMA

## 2024-01-08 NOTE — Patient Instructions (Addendum)
 First Trimester of Pregnancy  The first trimester of pregnancy starts on the first day of your last monthly period until the end of week 13. This is months 1 through 3 of pregnancy. A week after a sperm fertilizes an egg, the egg will implant into the wall of the uterus and begin to develop into a baby. Body changes during your first trimester Your body goes through many changes during pregnancy. The changes usually return to normal after your baby is born. Physical changes Your breasts may grow larger and may hurt. The area around your nipples may get darker. Your periods will stop. Your hair and nails may grow faster. You may pee more often. Health changes You may tire easily. Your gums may bleed and may be sensitive when you brush and floss. You may not feel hungry. You may have heartburn. You may throw up or feel like you may throw up. You may want to eat some foods, but not others. You may have headaches. You may have trouble pooping (constipation). Other changes Your emotions may change from day to day. You may have more dreams. Follow these instructions at home: Medicines Talk to your health care provider if you're taking medicines. Ask if the medicines are safe to take during pregnancy. Your provider may change the medicines that you take. Do not take any medicines unless told to by your provider. Take a prenatal vitamin that has at least 600 micrograms (mcg) of folic acid. Do not use herbal medicines, illegal substances, or medicines that are not approved by your provider. Eating and drinking While you're pregnant your body needs extra food for your growing baby. Talk with your provider about what to eat while pregnant. Activity Most women are able to exercise during pregnancy. Exercises may need to change as your pregnancy goes on. Talk to your provider about your activities and exercise routines. Relieving pain and discomfort Wear a good, supportive bra if your breasts  hurt. Rest with your legs raised if you have leg cramps or low back pain. Safety Wear your seatbelt at all times when you're in a car. Talk to your provider if someone hits you, hurts you, or yells at you. Talk with your provider if you're feeling sad or have thoughts of hurting yourself. Lifestyle Certain things can be harmful while you're pregnant. Follow these rules: Do not use hot tubs, steam rooms, or saunas. Do not douche. Do not use tampons or scented pads. Do not drink alcohol,smoke, vape, or use products with nicotine or tobacco in them. If you need help quitting, talk with your provider. Avoid cat litter boxes and soil used by cats. These things carry germs that can cause harm to your pregnancy and your baby. General instructions Keep all follow-up visits. It helps you and your unborn baby stay as healthy as possible. Write down your questions. Take them to your visits. Your provider will: Talk with you about your overall health. Give you advice or refer you to specialists who can help with different needs, including: Prenatal education classes. Mental health and counseling. Foods and healthy eating. Ask for help if you need help with food. Call your dentist and ask to be seen. Brush your teeth with a soft toothbrush. Floss gently. Where to find more information American Pregnancy Association: americanpregnancy.org [employer] of Obstetricians and Gynecologists: acog.org Office on [employer] Health: [employer] Contact a health care provider if: You feel dizzy, faint, or have a fever. You vomit or have watery poop (diarrhea) for 2  days or more. You have abnormal discharge or bleeding from your vagina. You have pain when you pee or your pee smells bad. You have cramps, pain, or pressure in your belly area. Get help right away if: You have trouble breathing or chest pain. You have any kind of injury, such as from a fall or a car crash. These symptoms may be an  emergency. Get help right away. Call 911. Do not wait to see if the symptoms will go away. Do not drive yourself to the hospital. This information is not intended to replace advice given to you by your health care provider. Make sure you discuss any questions you have with your health care provider. Document Revised: 10/13/2022 Document Reviewed: 05/13/2022 Elsevier Patient Education  2024 Elsevier Inc.Common Medications Safe in Pregnancy  Acne:      Constipation:  Benzoyl Peroxide     Colace  Clindamycin      Dulcolax Suppository  Topica Erythromycin     Fibercon  Salicylic Acid      Metamucil         Miralax AVOID:        Senakot   Accutane    Cough:  Retin-A       Cough Drops  Tetracycline      Phenergan w/ Codeine if Rx  Minocycline      Robitussin (Plain & DM)  Antibiotics:     Crabs/Lice:  Ceclor       RID  Cephalosporins    AVOID:  E-Mycins      Kwell  Keflex  Macrobid/Macrodantin   Diarrhea:  Penicillin      Kao-Pectate  Zithromax      Imodium AD         PUSH FLUIDS AVOID:       Cipro     Fever:  Tetracycline      Tylenol (Regular or Extra  Minocycline       Strength)  Levaquin      Extra Strength-Do not          Exceed 8 tabs/24 hrs Caffeine:        200mg /day (equiv. To 1 cup of coffee or  approx. 3 12 oz sodas)         Gas: Cold/Hayfever:       Gas-X  Benadryl      Mylicon  Claritin       Phazyme  **Claritin-D        Chlor-Trimeton    Headaches:  Dimetapp      ASA-Free Excedrin  Drixoral-Non-Drowsy     Cold Compress  Mucinex (Guaifenasin)     Tylenol (Regular or Extra  Sudafed/Sudafed-12 Hour     Strength)  **Sudafed PE Pseudoephedrine   Tylenol Cold & Sinus     Vicks Vapor Rub  Zyrtec  **AVOID if Problems With Blood Pressure         Heartburn: Avoid lying down for at least 1 hour after meals  Aciphex      Maalox     Rash:  Milk of Magnesia     Benadryl    Mylanta       1% Hydrocortisone Cream  Pepcid  Pepcid Complete   Sleep  Aids:  Prevacid      Ambien   Prilosec       Benadryl  Rolaids       Chamomile Tea  Tums (Limit 4/day)     Unisom         Tylenol PM  Warm milk-add vanilla or  Hemorrhoids:       Sugar for taste  Anusol/Anusol H.C.  (RX: Analapram 2.5%)  Sugar Substitutes:  Hydrocortisone OTC     Ok in moderation  Preparation H      Tucks        Vaseline lotion applied to tissue with wiping    Herpes:     Throat:  Acyclovir      Oragel  Famvir  Valtrex     Vaccines:         Flu Shot Leg Cramps:       *Gardasil  Benadryl      Hepatitis A         Hepatitis B Nasal Spray:       Pneumovax  Saline Nasal Spray     Polio Booster         Tetanus Nausea:       Tuberculosis test or PPD  Vitamin B6 25 mg TID   AVOID:    Dramamine      *Gardasil  Emetrol       Live Poliovirus  Ginger Root 250 mg QID    MMR (measles, mumps &  High Complex Carbs @ Bedtime    rebella)  Sea Bands-Accupressure    Varicella (Chickenpox)  Unisom 1/2 tab TID     *No known complications           If received before Pain:         Known pregnancy;   Darvocet       Resume series after  Lortab        Delivery  Percocet    Yeast:   Tramadol      Femstat  Tylenol 3      Gyne-lotrimin  Ultram       Monistat  Vicodin           MISC:         All Sunscreens           Hair Coloring/highlights          Insect Repellant's          (Including DEET)         Mystic Tans

## 2024-01-15 ENCOUNTER — Telehealth

## 2024-01-15 DIAGNOSIS — Z3689 Encounter for other specified antenatal screening: Secondary | ICD-10-CM

## 2024-01-15 DIAGNOSIS — Z3480 Encounter for supervision of other normal pregnancy, unspecified trimester: Secondary | ICD-10-CM

## 2024-01-15 NOTE — Patient Instructions (Signed)
 First Trimester of Pregnancy  The first trimester of pregnancy starts on the first day of your last monthly period until the end of week 13. This is months 1 through 3 of pregnancy. A week after a sperm fertilizes an egg, the egg will implant into the wall of the uterus and begin to develop into a baby. Body changes during your first trimester Your body goes through many changes during pregnancy. The changes usually return to normal after your baby is born. Physical changes Your breasts may grow larger and may hurt. The area around your nipples may get darker. Your periods will stop. Your hair and nails may grow faster. You may pee more often. Health changes You may tire easily. Your gums may bleed and may be sensitive when you brush and floss. You may not feel hungry. You may have heartburn. You may throw up or feel like you may throw up. You may want to eat some foods, but not others. You may have headaches. You may have trouble pooping (constipation). Other changes Your emotions may change from day to day. You may have more dreams. Follow these instructions at home: Medicines Talk to your health care provider if you're taking medicines. Ask if the medicines are safe to take during pregnancy. Your provider may change the medicines that you take. Do not take any medicines unless told to by your provider. Take a prenatal vitamin that has at least 600 micrograms (mcg) of folic acid. Do not use herbal medicines, illegal substances, or medicines that are not approved by your provider. Eating and drinking While you're pregnant your body needs extra food for your growing baby. Talk with your provider about what to eat while pregnant. Activity Most women are able to exercise during pregnancy. Exercises may need to change as your pregnancy goes on. Talk to your provider about your activities and exercise routines. Relieving pain and discomfort Wear a good, supportive bra if your breasts  hurt. Rest with your legs raised if you have leg cramps or low back pain. Safety Wear your seatbelt at all times when you're in a car. Talk to your provider if someone hits you, hurts you, or yells at you. Talk with your provider if you're feeling sad or have thoughts of hurting yourself. Lifestyle Certain things can be harmful while you're pregnant. Follow these rules: Do not use hot tubs, steam rooms, or saunas. Do not douche. Do not use tampons or scented pads. Do not drink alcohol,smoke, vape, or use products with nicotine or tobacco in them. If you need help quitting, talk with your provider. Avoid cat litter boxes and soil used by cats. These things carry germs that can cause harm to your pregnancy and your baby. General instructions Keep all follow-up visits. It helps you and your unborn baby stay as healthy as possible. Write down your questions. Take them to your visits. Your provider will: Talk with you about your overall health. Give you advice or refer you to specialists who can help with different needs, including: Prenatal education classes. Mental health and counseling. Foods and healthy eating. Ask for help if you need help with food. Call your dentist and ask to be seen. Brush your teeth with a soft toothbrush. Floss gently. Where to find more information American Pregnancy Association: americanpregnancy.org [employer]: acog.org Office on [employer] Health: [place] Contact a health care provider if: You feel dizzy, faint, or have a fever. You vomit or have watery poop (diarrhea) for 2  days or more. You have abnormal discharge or bleeding from your vagina. You have pain when you pee or your pee smells bad. You have cramps, pain, or pressure in your belly area. Get help right away if: You have trouble breathing or chest pain. You have any kind of injury, such as from a fall or a car crash. These symptoms may be an  emergency. Get help right away. Call 911. Do not wait to see if the symptoms will go away. Do not drive yourself to the hospital. This information is not intended to replace advice given to you by your health care provider. Make sure you discuss any questions you have with your health care provider. Document Revised: 10/13/2022 Document Reviewed: 05/13/2022 Elsevier Patient Education  2024 [employer] Commonly Asked Questions During Pregnancy  Cats: A parasite can be excreted in cat feces.  To avoid exposure you need to have another person empty the little box.  If you must empty the litter box you will need to wear gloves.  Wash your hands after handling your cat.  This parasite can also be found in raw or undercooked meat so this should also be avoided.  Colds, Sore Throats, Flu: Please check your medication sheet to see what you can take for symptoms.  If your symptoms are unrelieved by these medications please call the office.  Dental Work: Most any dental work agricultural consultant recommends is permitted.  X-rays should only be taken during the first trimester if absolutely necessary.  Your abdomen should be shielded with a lead apron during all x-rays.  Please notify your provider prior to receiving any x-rays.  Novocaine is fine; gas is not recommended.  If your dentist requires a note from us  prior to dental work please call the office and we will provide one for you.  Exercise: Exercise is an important part of staying healthy during your pregnancy.  You may continue most exercises you were accustomed to prior to pregnancy.  Later in your pregnancy you will most likely notice you have difficulty with activities requiring balance like riding a bicycle.  It is important that you listen to your body and avoid activities that put you at a higher risk of falling.  Adequate rest and staying well hydrated are a must!  If you have questions about the safety of specific activities ask your provider.     Exposure to Children with illness: Try to avoid obvious exposure; report any symptoms to us  when noted,  If you have chicken pos, red measles or mumps, you should be immune to these diseases.   Please do not take any vaccines while pregnant unless you have checked with your OB provider.  Fetal Movement: After 28 weeks we recommend you do kick counts twice daily.  Lie or sit down in a calm quiet environment and count your baby movements kicks.  You should feel your baby at least 10 times per hour.  If you have not felt 10 kicks within the first hour get up, walk around and have something sweet to eat or drink then repeat for an additional hour.  If count remains less than 10 per hour notify your provider.  Fumigating: Follow your pest control agent's advice as to how long to stay out of your home.  Ventilate the area well before re-entering.  Hemorrhoids:   Most over-the-counter preparations can be used during pregnancy.  Check your medication to see what is safe to use.  It is important to use  a stool softener or fiber in your diet and to drink lots of liquids.  If hemorrhoids seem to be getting worse please call the office.   Hot Tubs:  Hot tubs Jacuzzis and saunas are not recommended while pregnant.  These increase your internal body temperature and should be avoided.  Intercourse:  Sexual intercourse is safe during pregnancy as long as you are comfortable, unless otherwise advised by your provider.  Spotting may occur after intercourse; report any bright red bleeding that is heavier than spotting.  Labor:  If you know that you are in labor, please go to the hospital.  If you are unsure, please call the office and let us  help you decide what to do.  Lifting, straining, etc:  If your job requires heavy lifting or straining please check with your provider for any limitations.  Generally, you should not lift items heavier than that you can lift simply with your hands and arms (no back  muscles)  Painting:  Paint fumes do not harm your pregnancy, but may make you ill and should be avoided if possible.  Latex or water based paints have less odor than oils.  Use adequate ventilation while painting.  Permanents & Hair Color:  Chemicals in hair dyes are not recommended as they cause increase hair dryness which can increase hair loss during pregnancy.   Highlighting and permanents are allowed.  Dye may be absorbed differently and permanents may not hold as well during pregnancy.  Sunbathing:  Use a sunscreen, as skin burns easily during pregnancy.  Drink plenty of fluids; avoid over heating.  Tanning Beds:  Because their possible side effects are still unknown, tanning beds are not recommended.  Ultrasound Scans:  Routine ultrasounds are performed at approximately 20 weeks.  You will be able to see your baby's general anatomy an if you would like to know the gender this can usually be determined as well.  If it is questionable when you conceived you may also receive an ultrasound early in your pregnancy for dating purposes.  Otherwise ultrasound exams are not routinely performed unless there is a medical necessity.  Although you can request a scan we ask that you pay for it when conducted because insurance does not cover  patient request scans.  Work: If your pregnancy proceeds without complications you may work until your due date, unless your physician or employer advises otherwise.  Round Ligament Pain/Pelvic Discomfort:  Sharp, shooting pains not associated with bleeding are fairly common, usually occurring in the second trimester of pregnancy.  They tend to be worse when standing up or when you remain standing for long periods of time.  These are the result of pressure of certain pelvic ligaments called round ligaments.  Rest, Tylenol and heat seem to be the most effective relief.  As the womb and fetus grow, they rise out of the pelvis and the discomfort improves.  Please  notify the office if your pain seems different than that described.  It may represent a more serious condition.   Common Medications Safe in Pregnancy  Acne:      Constipation:  Benzoyl Peroxide     Colace  Clindamycin      Dulcolax Suppository  Topica Erythromycin     Fibercon  Salicylic Acid      Metamucil         Miralax AVOID:        Senakot   Accutane    Cough:  Retin-A  Cough Drops  Tetracycline      Phenergan w/ Codeine if Rx  Minocycline      Robitussin (Plain & DM)  Antibiotics:     Crabs/Lice:  Ceclor       RID  Cephalosporins    AVOID:  E-Mycins      Kwell  Keflex  Macrobid/Macrodantin   Diarrhea:  Penicillin      Kao-Pectate  Zithromax      Imodium AD         PUSH FLUIDS AVOID:       Cipro     Fever:  Tetracycline      Tylenol (Regular or Extra  Minocycline       Strength)  Levaquin      Extra Strength-Do not          Exceed 8 tabs/24 hrs Caffeine:        200mg /day (equiv. To 1 cup of coffee or  approx. 3 12 oz sodas)         Gas: Cold/Hayfever:       Gas-X  Benadryl      Mylicon  Claritin       Phazyme  **Claritin-D        Chlor-Trimeton    Headaches:  Dimetapp      ASA-Free Excedrin  Drixoral-Non-Drowsy     Cold Compress  Mucinex (Guaifenasin)     Tylenol (Regular or Extra  Sudafed/Sudafed-12 Hour     Strength)  **Sudafed PE Pseudoephedrine   Tylenol Cold & Sinus     Vicks Vapor Rub  Zyrtec  **AVOID if Problems With Blood Pressure         Heartburn: Avoid lying down for at least 1 hour after meals  Aciphex      Maalox     Rash:  Milk of Magnesia     Benadryl    Mylanta       1% Hydrocortisone Cream  Pepcid  Pepcid Complete   Sleep Aids:  Prevacid      Ambien   Prilosec       Benadryl  Rolaids       Chamomile Tea  Tums (Limit 4/day)     Unisom         Tylenol PM         Warm milk-add vanilla or  Hemorrhoids:       Sugar for taste  Anusol/Anusol H.C.  (RX: Analapram 2.5%)  Sugar Substitutes:  Hydrocortisone OTC     Ok in  moderation  Preparation H      Tucks        Vaseline lotion applied to tissue with wiping    Herpes:     Throat:  Acyclovir      Oragel  Famvir  Valtrex     Vaccines:         Flu Shot Leg Cramps:       *Gardasil  Benadryl      Hepatitis A         Hepatitis B Nasal Spray:       Pneumovax  Saline Nasal Spray     Polio Booster         Tetanus Nausea:       Tuberculosis test or PPD  Vitamin B6 25 mg TID   AVOID:    Dramamine      *Gardasil  Emetrol       Live Poliovirus  Ginger Root 250 mg QID    MMR (measles, mumps &  High Complex Carbs @ Bedtime    rebella)  Sea Bands-Accupressure    Varicella (Chickenpox)  Unisom 1/2 tab TID     *No known complications           If received before Pain:         Known pregnancy;   Darvocet       Resume series after  Lortab        Delivery  Percocet    Yeast:   Tramadol      Femstat  Tylenol 3      Gyne-lotrimin  Ultram       Monistat  Vicodin           MISC:         All Sunscreens           Hair Coloring/highlights          Insect Repellant's          (Including DEET)         Mystic Tans  Tests and Screening During Pregnancy Tests and screenings during pregnancy are an important part of your prenatal care. These tests help your health care provider find any problems that might affect your pregnancy. Some tests need to be done for all pregnant people, and some are optional. Most of the tests and screenings do not pose any risks for you or your baby. You may need more testing if a test result shows there is a risk to your health or your baby's health. Tests and screenings done early in pregnancy Some tests and screenings you may have in early pregnancy are: Blood tests, such as: Complete blood count (CBC). Blood typing. Tests to check for diseases that can cause birth defects or can be passed to your baby, such as: German measles (rubella( and chicken pox. Hepatitis B and C. Human Immunodeficiency Virus (HIV). Syphilis. Zika  virus. Pee tests. Blood pressure. Testing for sexually transmitted infections (STIs), such as chlamydia or gonorrhea. Testing for tuberculosis. Ultrasound. Tests and screenings done later in pregnancy Some common tests you can expect to have later in pregnancy include: Rh antibody testing. Pee and blood tests. Glucose screening. This checks your blood sugar. It will show whether you are developing the type of diabetes that happens during pregnancy, called gestational diabetes. You may have this screening earlier if you have risk factors for diabetes. Ultrasound. This may be repeated at 16-20 weeks to check how your baby is growing. Screening for group B streptococcus (GBS). GBS is a type of bacteria that may live in your rectum or vagina. GBS can spread to your baby during birth. This test is done at 35-37 weeks of pregnancy. Non-stress test. This may be done more often if your pregnancy is high risk. Biophysical profile. This test includes ultrasound imaging and a non-stress test to check to see if your baby is healthy. This test may help decide when your baby should be born. Screening for birth defects Early in your pregnancy, tests can be done to find out if your baby is at risk for a genetic disorder. This testing is optional. The type of testing recommended for you will depend on your family and medical history, your ethnicity, and your age. Testing may include: Screening tests such as ultrasound, blood tests, or a combination of both. Carrier screening. If genetic screening shows that your baby is at risk for a genetic defect, diagnostic testing may be recommended, such as: Amniocentesis. Chorionic villus sampling. Unlike other tests done  during pregnancy, diagnostic testing does have some risk for your pregnancy. Talk to your provider about the risks and benefits of genetic testing. Questions to ask your health care provider What tests are recommended for me? When and how will these  tests be done? When will I get the results of the tests? What do the results of these tests mean for me or my baby? Do you recommend any genetic screening tests? Which ones? Should I see a genetic counselor before having genetic screening? Where to find more information Go to americanpregnancy.org Click on search. Type 'prenatal tests in the search box. Go to [place] Click on search. Type 'prenatal tests in the search box. Go to acog.org Click on search. Type routine tests in the search box. This information is not intended to replace advice given to you by your health care provider. Make sure you discuss any questions you have with your health care provider. Document Revised: 11/08/2022 Document Reviewed: 11/08/2022 Elsevier Patient Education  2025 [employer]. Questions to Ask Your Health Care Provider During Pregnancy  During pregnancy, you'll go through many changes. These will affect your body as well as your feelings and emotions. And you'll likely have a lot of questions. Your health care team is a good source for reliable answers. Make an appointment with your team if you're planning to get pregnant or as soon as you know that you're pregnant. New questions will come up as your pregnancy continues. Write your questions down and take them with you to your prenatal visits. Questions to ask about pregnancy Prenatal visits and tests How often will I have my prenatal visits? Should I visit the dentist during pregnancy? What type of screening tests should I consider? What type of routine tests are suggested and when are they done? What are the risks and benefits of these tests? When would you recommend an ultrasound, and what will it show? Is there a nurse line or office line I can call if I have questions? Caring for yourself during pregnancy How much weight should I gain? What kind of exercise should I get and how much? How much sleep should I get? What kinds of  things can I do to help with stress and anxiety? Are there any travel restrictions? Is it OK to have sex? Eating and drinking during pregnancy What vitamins or supplements do you recommend? When should I start taking my prenatal vitamins? What's a healthy diet for me during pregnancy? What foods should I eat? What foods should I not eat? What if I'm on a special diet? Should I limit how much caffeine I have? Vaccinations What shots should I get? When is the best time to get them? Are there shots I should not get? Medicine and substance use during pregnancy Are my current medicines OK to keep taking? Which medicines could hurt me or my baby? Which supplements or herbal medicines could hurt me or my baby? Why is it important not to smoke or drink alcohol? What about other drugs or substances? Where can I get help if I'm having a hard time quitting? Questions to ask about labor and birth Getting ready What are my birth options? Should I make a birth plan? Where can I have my baby? Is a birth center or home birth an option for me? What are the benefits of breastfeeding? When should I start preparing for breastfeeding? Classes Are there breastfeeding classes or support groups? Where can I find childbirth classes? Pain relief What is natural childbirth?  What can I do to decrease pain during labor and birth? What are other methods to help with pain during labor and birth? Birth What is induced labor? What's an episiotomy, and when might I need one? How can I increase my chance for a vaginal birth? When would a C-section be advised? Can I have a vaginal birth if I had a C-section in the past? Other questions to ask How long will I need to stay in the hospital after giving birth? How soon can I get pregnant after giving birth? What are my birth control options? What are the signs of perinatal depression? What should I do if I have depression or anxiety that's getting worse? This  information is not intended to replace advice given to you by your health care provider. Make sure you discuss any questions you have with your health care provider. Document Revised: 10/04/2022 Document Reviewed: 10/04/2022 Elsevier Patient Education  2024 [employer]. How a Baby Grows During Pregnancy Pregnancy starts when a fertilized egg attaches to the lining of the uterus and begins to grow. It is a time of many changes in the mother's body. The changes happen: To support your pregnancy. To help the baby grow. To prepare for the birth of your baby. How long does a pregnancy last? A pregnancy usually lasts 280 days, or about 40 weeks. Pregnancy is divided into three periods of growth, also called trimesters: First trimester: weeks 0-13. Second trimester: weeks 14-27. Third trimester: weeks 28-40. The end of the 40 weeks of pregnancy is your likely date of delivery, or due date. However, most babies are not born on their due date. How does my baby develop month by month?  First and second month The brain, spinal cord, and heart begin to develop. By 6 weeks, the heart begins to beat. The face, arms, and legs begin to form. Then the hands and feet begin to develop. All major organs begin to develop by the end of the second month. Third month All of the internal organs are forming. Bones and muscles are beginning to grow. The fetus is making movements similar to breathing. Fingernails and toenails are forming. Fourth month The skin is thin and transparent. The neck, outer ear, eyelids, and fingernails are formed. The external sex organs are formed. The fetus can hear, swallow, and move easily. The kidneys begin to make pee (urine). Fifth month The fetus moves around more and can be felt for the first time (quickening). The face, nose, and lips can be seen easily on ultrasound. The baby is covered with soft hair called lanugo. The organs in the digestive system work. Sixth  month The lungs continue to grow and mature. The eyes open. The brain continues to develop. The fetus may begin to suck a finger. Skin ridges are formed that will be fingerprints and toe prints. Hair grows thicker and eyebrows can be seen. Seventh month Lungs are fully developed but not yet ready for birth. Eyes are developed enough to sense changes in light. The fetus responds to sound. The fetus kicks and stretches. Hands can make a grasping motion. Vernix, a waxy coating, is starting to develop to protect the skin. Eighth month Most organs and body systems are fully developed and working. Bones harden, and taste buds develop. The fetus may hiccup. The brain is still developing. The skull remains soft. By week 31, most development is complete and the fetus is gaining weight fast. By the end of week 32, the fetus weighs a  little more than 4 pounds (1.8 kg). Ninth month until your due date The lungs are fully developed and ready for birth. Patterns of sleep develop. The fetus weighs around 6 pounds at the beginning of the ninth month and may weigh around 8 pounds by your due date. The fetus's head typically moves into a head-down position in the uterus. Closer to your due date, the fetus's head may drop lower in your hips. How do I know if my baby is developing well? Always talk with your health care provider about any concerns that you may have about your pregnancy and your baby. At prenatal visits, your provider will do tests to check on your health and keep track of your baby's growth. These include: Fundal height and position. To do this, your provider will: Measure your growing belly from your pubic bone to the top of the uterus using a tape measure. Feel your belly to determine your baby's position. Heartbeat. An ultrasound in the first trimester can confirm pregnancy and show a heartbeat, depending on how far along you are. Your provider will check your baby's heart rate at  prenatal visits. You may also have a second trimester ultrasound to check your baby's development. Follow these instructions at home: Take your medicines as told. Take prenatal vitamins as told by your provider. These include vitamins such as folic acid, iron, calcium, and vitamin D. They are important for healthy development of your baby. Keep all follow-up visits. These include prenatal care and screening tests to check your health and your baby's health. This information is not intended to replace advice given to you by your health care provider. Make sure you discuss any questions you have with your health care provider. Document Revised: 06/13/2022 Document Reviewed: 06/13/2022 Elsevier Patient Education  2024 [employer].

## 2024-01-15 NOTE — Progress Notes (Signed)
 New OB Intake  I connected with  Jane Cordova on 01/15/2024 at  1:15 PM EST by telephone visit and verified that I am speaking with the correct person using two identifiers. Nurse is located at [facility] and pt is located at home.  I discussed the limitations, risks, security and privacy concerns of performing an evaluation and management service by telephone and the availability of in person appointments. I also discussed with the patient that there may be a patient responsible charge related to this service. The patient expressed understanding and agreed to proceed.  I explained I am completing New OB Intake today. We discussed her EDD of 08/25/24 that is based on LMP of 11/19/23. Pt is G2/P1001. I reviewed her allergies, medications, Medical/Surgical/OB history, and appropriate screenings. There are cats in the home: no. Based on history, this is a/an pregnancy uncomplicated . Her obstetrical history is significant for none.  Patient Active Problem List   Diagnosis Date Noted   Supervision of other normal pregnancy, antepartum 12/23/2021    Concerns addressed today: None  Delivery Plans:  Plans to deliver at [facility].  Anatomy US  Explained first scheduled US  will be 01/30/24. Anatomy US  will be scheduled around [redacted] weeks gestational age.  Labs Discussed genetic screening with patient. Patient desires genetic testing to be drawn at new OB visit. Discussed possible labs to be drawn at new OB appointment.  COVID Vaccine Patient has not had COVID vaccine.   Social Determinants of Health Food Insecurity: denies food insecurity WIC Referral: Patient is not interested in referral to [facility].  Transportation: Patient denies transportation needs. Childcare: Discussed no children allowed at ultrasound appointments.   First visit review I reviewed new OB appt with pt. I explained she will have blood work and pap smear/pelvic exam if indicated. Explained pt will be seen by  Jinnie Cookey, CNM at first visit; encounter routed to appropriate provider.   [place], [place] [id number]  4:02 PM

## 2024-01-16 NOTE — Telephone Encounter (Signed)
 Patient completed her NOB intake.

## 2024-01-30 ENCOUNTER — Ambulatory Visit (INDEPENDENT_AMBULATORY_CARE_PROVIDER_SITE_OTHER)

## 2024-01-30 DIAGNOSIS — Z3A10 10 weeks gestation of pregnancy: Secondary | ICD-10-CM

## 2024-01-30 DIAGNOSIS — O3680X0 Pregnancy with inconclusive fetal viability, not applicable or unspecified: Secondary | ICD-10-CM

## 2024-01-30 DIAGNOSIS — Z3687 Encounter for antenatal screening for uncertain dates: Secondary | ICD-10-CM

## 2024-02-12 ENCOUNTER — Encounter: Admitting: Licensed Practical Nurse

## 2024-02-12 NOTE — Progress Notes (Unsigned)
 NEW OB HISTORY AND PHYSICAL  SUBJECTIVE:       Jane Cordova is a 24 y.o. G7P1001 female, Patient's last menstrual period was 11/19/2023 (exact date)., Estimated Date of Delivery: 08/25/24, [redacted]w[redacted]d, presents today for establishment of Prenatal Care.  This pregnancy was not avoided  Never took OCPs  She had an US  on 01/30/24 that showed an SIUP at [redacted]w[redacted]d She reports  N/V, requested zofran   Gained 60 pounds during first pregnancy PP:Bad anxiety, got prescribed medication but was too nervous to take it Breastfed then pumping and formula   Social history Partner/Relationship: married  Living situation: at home, husband and 59 mo boy Work: [employer] Exercise: no Substance use: vapes, no drugs, no alcohol  Indications for ASA therapy (per uptodate) One of the following: Previous pregnancy with preeclampsia, especially early onset and with an adverse outcome No Multifetal gestation No Chronic hypertension No Type 1 or 2 diabetes mellitus No Chronic kidney disease No Autoimmune disease (antiphospholipid syndrome, systemic lupus erythematosus) No  Two or more of the following: Nulliparity No Obesity (body mass index >30 kg/m2) No Family history of preeclampsia in mother or sister No Age >=35 years No Sociodemographic characteristics (African American race, low socioeconomic level) No Personal risk factors (eg, previous pregnancy with low birth weight or small for gestational age infant, previous adverse pregnancy outcome [eg, stillbirth], interval >10 years between pregnancies) No  Indications for early GDM screening  First-degree relative with diabetes No BMI >30kg/m2 No Age > 35 No Previous birth of an infant weighing >=4000 g No Gestational diabetes mellitus in a previous pregnancy No Glycated hemoglobin >=5.7 percent (39 mmol/mol), impaired glucose tolerance, or impaired fasting glucose on previous testing No High-risk race/ethnicity (eg, African American, Latino, Native American,  Asian American, Pacific Islander) No Previous stillbirth of unknown cause No Maternal birthweight > 9 lbs No born early History of cardiovascular disease No Hypertension or on therapy for hypertension No High-density lipoprotein cholesterol level <35 mg/dL (9.09 mmol/L) and/or a triglyceride level >250 mg/dL (7.17 mmol/L) No Polycystic ovary syndrome No Physical inactivity Yes Other clinical condition associated with insulin resistance (eg, severe obesity, acanthosis nigricans) No Current use of glucocorticoids No   Early screening tests: FBS, A1C, Random CBG, glucose challenge  Gynecologic History Patient's last menstrual period was 11/19/2023 (exact date). Normal occur monthly  Contraception: none Last Pap: first ever collected today   Obstetric History OB History  Gravida Para Term Preterm AB Living  2 1 1   1   SAB IAB Ectopic Multiple Live Births     0 1    # Outcome Date GA Lbr Len/2nd Weight Sex Type Anes PTL Lv  2 Current           1 Term 06/24/22 [redacted]w[redacted]d / 03:50 7 lb 0.9 oz (3.2 kg) M Vag-Spont EPI  LIV    Past Medical History:  Diagnosis Date   History of arthroscopy of knee 08/12/2021   No pertinent past medical history     Past Surgical History:  Procedure Laterality Date   OTHER SURGICAL HISTORY     Ligament Reconstruction on Left Knee    Medications Ordered Prior to Encounter[1]  Allergies[2]  Social History   Socioeconomic History   Marital status: Married    Spouse name: Norman   Number of children: 1   Years of education: 13   Highest education level: Some college, no degree  Occupational History   Occupation: Stay At Home  Tobacco Use   Smoking status: Never  Smokeless tobacco: Never  Vaping Use   Vaping status: Former   Substances: Nicotine   Substance and Sexual Activity   Alcohol use: Not Currently   Drug use: Not Currently   Sexual activity: Yes    Partners: Male    Birth control/protection: None  Other Topics Concern   Not on  file  Social History Narrative   Not on file   Social Drivers of Health   Tobacco Use: Low Risk (02/13/2024)   Patient History    Smoking Tobacco Use: Never    Smokeless Tobacco Use: Never    Passive Exposure: Not on file  Financial Resource Strain: Medium Risk (01/15/2024)   Overall Financial Resource Strain (CARDIA)    Difficulty of Paying Living Expenses: Somewhat hard  Food Insecurity: No Food Insecurity (01/15/2024)   Epic    Worried About Radiation Protection Practitioner of Food in the Last Year: Never true    Ran Out of Food in the Last Year: Never true  Transportation Needs: No Transportation Needs (01/15/2024)   Epic    Lack of Transportation (Medical): No    Lack of Transportation (Non-Medical): No  Physical Activity: Insufficiently Active (01/15/2024)   Exercise Vital Sign    Days of Exercise per Week: 2 days    Minutes of Exercise per Session: 30 min  Stress: No Stress Concern Present (01/15/2024)   [employer] of Occupational Health - Occupational Stress Questionnaire    Feeling of Stress: Only a little  Social Connections: Moderately Integrated (01/15/2024)   Social Connection and Isolation Panel    Frequency of Communication with Friends and Family: More than three times a week    Frequency of Social Gatherings with Friends and Family: Once a week    Attends Religious Services: 1 to 4 times per year    Active Member of [employer] or Organizations: No    Attends Banker Meetings: Not on file    Marital Status: Married  Catering Manager Violence: Not At Risk (01/15/2024)   Epic    Fear of Current or Ex-Partner: No    Emotionally Abused: No    Physically Abused: No    Sexually Abused: No  Depression (PHQ2-9): Not on file  Alcohol Screen: Low Risk (01/15/2024)   Alcohol Screen    Last Alcohol Screening Score (AUDIT): 4  Housing: Low Risk (01/15/2024)   Epic    Unable to Pay for Housing in the Last Year: No    Number of Times Moved in the Last Year: 0    Homeless  in the Last Year: No  Utilities: Not At Risk (01/15/2024)   Epic    Threatened with loss of utilities: No  Health Literacy: Adequate Health Literacy (01/15/2024)   B1300 Health Literacy    Frequency of need for help with medical instructions: Never    Family History  Problem Relation Age of Onset   Healthy Father    Healthy Brother    Diabetes Maternal Grandmother    Heart Problems Maternal Grandmother    Diabetes Maternal Grandfather    Lung cancer Paternal Grandmother    Skin cancer Paternal Grandmother    Skin cancer Paternal Grandfather    Breast cancer Maternal Great-grandmother 64   Multiple myeloma Maternal Great-grandmother 78    The following portions of the patient's history were reviewed and updated as appropriate: allergies, current medications, past OB history, past medical history, past surgical history, past family history, past social history, and problem list.  Constitutional: Denied constitutional symptoms, night  sweats, recent illness, fatigue, fever, insomnia and weight loss.  Eyes: Denied eye symptoms, eye pain, photophobia, vision change and visual disturbance.  Ears/Nose/Throat/Neck: Denied ear, nose, throat or neck symptoms, hearing loss, nasal discharge, sinus congestion and sore throat.  Cardiovascular: Denied cardiovascular symptoms, arrhythmia, chest pain/pressure, edema, exercise intolerance, orthopnea and palpitations.  Respiratory: Denied pulmonary symptoms, asthma, pleuritic pain, productive sputum, cough, dyspnea and wheezing.  Gastrointestinal: Denied gastro-esophageal reflux, melena, YES nausea and vomiting.  Genitourinary: Denied genitourinary symptoms including symptomatic vaginal discharge, pelvic relaxation issues, and urinary complaints.  Musculoskeletal: Denied musculoskeletal symptoms, stiffness, swelling, muscle weakness and myalgia.  Dermatologic: Denied dermatology symptoms, rash and scar.  Neurologic: Denied neurology symptoms,  dizziness, headache, neck pain and syncope.  Psychiatric: Denied psychiatric symptoms, anxiety and depression.  Endocrine: Denied endocrine symptoms including hot flashes and night sweats.     OBJECTIVE: Initial Physical Exam (New OB) BP (!) 134/93   Pulse 99   Wt 156 lb 11.2 oz (71.1 kg)   LMP 11/19/2023 (Exact Date)   BMI 24.54 kg/m  Physical Exam Constitutional:      Appearance: Normal appearance.  Cardiovascular:     Rate and Rhythm: Normal rate and regular rhythm.     Pulses: Normal pulses.     Heart sounds: Normal heart sounds.  Pulmonary:     Effort: Pulmonary effort is normal.     Breath sounds: Normal breath sounds.  Chest:     Comments: Breasts: no redness or masses, nipples intact bilaterally  Abdominal:     Tenderness: There is no abdominal tenderness.  Genitourinary:    General: Normal vulva.     Comments: Cervix pink no lesions no bleeding   Bimanual exam uterus 12wks sized non tender no masses  Musculoskeletal:        General: Normal range of motion.     Cervical back: Normal range of motion and neck supple.     Right lower leg: No edema.     Left lower leg: No edema.  Skin:    General: Skin is warm.  Neurological:     General: No focal deficit present.     Mental Status: She is alert. Mental status is at baseline.  Psychiatric:        Mood and Affect: Mood normal.        Thought Content: Thought content normal.     Fetal Heart Rate (bpm): 160  ASSESSMENT: Normal pregnancy   PLAN: Routine prenatal care. We discussed an overview of prenatal care and when to call. Reviewed diet, exercise, and weight gain recommendations in pregnancy. Discussed benefits of breastfeeding and lactation resources at [facility]. I reviewed labs and answered all questions.  1. Encounter for supervision of other normal pregnancy in first trimester (Primary) - NOB Panel - Culture, OB Urine - Monitor Drug Profile 14(MW) - Nicotine  screen, urine - Urinalysis, Routine w  reflex microscopic - Hgb Fractionation Cascade - Hemoglobin A1c - TSH + free T4 - ondansetron  (ZOFRAN -ODT) 4 MG disintegrating tablet; Take 1 tablet (4 mg total) by mouth every 6 (six) hours as needed for nausea.  Dispense: 20 tablet; Refill: 0 - MaterniT21 PLUS Core - Cytology - PAP - Comprehensive metabolic panel with GFR - Protein / creatinine ratio, urine - US  OB Comp + 14 Wk; Future  2. [redacted] weeks gestation of pregnancy - ondansetron  (ZOFRAN -ODT) 4 MG disintegrating tablet; Take 1 tablet (4 mg total) by mouth every 6 (six) hours as needed for nausea.  Dispense: 20 tablet; Refill: 0 -  Comprehensive metabolic panel with GFR - Protein / creatinine ratio, urine  3. Nausea and vomiting in pregnancy - ondansetron  (ZOFRAN -ODT) 4 MG disintegrating tablet; Take 1 tablet (4 mg total) by mouth every 6 (six) hours as needed for nausea.  Dispense: 20 tablet; Refill: 0 - Doxylamine -Pyridoxine  (DICLEGIS ) 10-10 MG TBEC; Take 2 tablets by mouth at bedtime. If symptoms persist, add one tablet in the morning and one in the afternoon  Dispense: 100 tablet; Refill: 5  4. Genetic screening - MaterniT21 PLUS Core  5. Encounter for drug screening  6. Cervical cancer screening - Cytology - PAP  7. Screening examination for venereal disease - NOB Panel - Cytology - PAP  8. Low risk ultrasound survey, pregnancy - US  OB Comp + 14 Wk; Future   Kimia Finan M Kerrington [place]hal[place], CNM      [1]  Current Outpatient Medications on File Prior to Visit  Medication Sig Dispense Refill   Prenatal Vit-Fe Fumarate-FA (PRENATAL PO) Take by mouth.     acetaminophen  (TYLENOL ) 325 MG tablet Take 2 tablets (650 mg total) by mouth every 4 (four) hours as needed (for pain scale < 4). (Patient not taking: Reported on 01/15/2024)     brompheniramine-pseudoephedrine-DM 30-2-10 MG/5ML syrup Take 5 mLs by mouth 4 (four) times daily as needed. (Patient not taking: Reported on 01/15/2024) 120 mL 0   docusate sodium  (COLACE) 100 MG capsule  Take 1 capsule (100 mg total) by mouth 2 (two) times daily. (Patient not taking: Reported on 01/15/2024) 10 capsule 0   ibuprofen  (ADVIL ) 600 MG tablet Take 1 tablet (600 mg total) by mouth every 6 (six) hours as needed. (Patient not taking: Reported on 01/15/2024) 30 tablet 0   lidocaine  (XYLOCAINE ) 2 % solution Swallow 5-10mL every 6 hours as needed for sore throat (Patient not taking: Reported on 01/15/2024) 100 mL 0   misoprostol  (CYTOTEC ) 200 MCG tablet Place 1 tablet (200 mcg total) vaginally once for 1 dose. At bedtime evening prior to procedure (Patient not taking: Reported on 01/15/2024) 1 tablet 0   norethindrone-ethinyl estradiol-FE (JUNEL FE 1/20) 1-20 MG-MCG tablet Take 1 tablet by mouth daily. (Patient not taking: Reported on 01/15/2024) 28 tablet 11   witch hazel-glycerin  (TUCKS) pad Apply topically as needed for itching. (Patient not taking: Reported on 01/15/2024) 40 each 12   No current facility-administered medications on file prior to visit.  [2] No Known Allergies

## 2024-02-13 ENCOUNTER — Other Ambulatory Visit (HOSPITAL_COMMUNITY)
Admission: RE | Admit: 2024-02-13 | Discharge: 2024-02-13 | Disposition: A | Source: Ambulatory Visit | Attending: Licensed Practical Nurse | Admitting: Licensed Practical Nurse

## 2024-02-13 ENCOUNTER — Encounter: Payer: Self-pay | Admitting: Licensed Practical Nurse

## 2024-02-13 ENCOUNTER — Ambulatory Visit (INDEPENDENT_AMBULATORY_CARE_PROVIDER_SITE_OTHER): Admitting: Licensed Practical Nurse

## 2024-02-13 VITALS — BP 134/93 | HR 99 | Wt 156.7 lb

## 2024-02-13 DIAGNOSIS — O219 Vomiting of pregnancy, unspecified: Secondary | ICD-10-CM

## 2024-02-13 DIAGNOSIS — Z113 Encounter for screening for infections with a predominantly sexual mode of transmission: Secondary | ICD-10-CM | POA: Diagnosis present

## 2024-02-13 DIAGNOSIS — Z124 Encounter for screening for malignant neoplasm of cervix: Secondary | ICD-10-CM | POA: Insufficient documentation

## 2024-02-13 DIAGNOSIS — Z3A12 12 weeks gestation of pregnancy: Secondary | ICD-10-CM

## 2024-02-13 DIAGNOSIS — Z3481 Encounter for supervision of other normal pregnancy, first trimester: Secondary | ICD-10-CM | POA: Diagnosis present

## 2024-02-13 DIAGNOSIS — Z0283 Encounter for blood-alcohol and blood-drug test: Secondary | ICD-10-CM

## 2024-02-13 DIAGNOSIS — Z1379 Encounter for other screening for genetic and chromosomal anomalies: Secondary | ICD-10-CM

## 2024-02-13 DIAGNOSIS — Z369 Encounter for antenatal screening, unspecified: Secondary | ICD-10-CM

## 2024-02-13 MED ORDER — DOXYLAMINE-PYRIDOXINE 10-10 MG PO TBEC: 2.0000 | DELAYED_RELEASE_TABLET | Freq: Every day | ORAL | 5 refills | Status: AC

## 2024-02-13 MED ORDER — ONDANSETRON 4 MG PO TBDP: 4.0000 mg | ORAL_TABLET | Freq: Four times a day (QID) | ORAL | 0 refills | Status: AC | PRN

## 2024-02-14 LAB — MONITOR DRUG PROFILE 14(MW)
Amphetamine Scrn, Ur: NEGATIVE ng/mL
BARBITURATE SCREEN URINE: NEGATIVE ng/mL
BENZODIAZEPINE SCREEN, URINE: NEGATIVE ng/mL
Buprenorphine, Urine: NEGATIVE ng/mL
CANNABINOIDS UR QL SCN: NEGATIVE ng/mL
Cocaine (Metab) Scrn, Ur: NEGATIVE ng/mL
Creatinine(Crt), U: 41.3 mg/dL (ref 20.0–300.0)
Fentanyl, Urine: NEGATIVE pg/mL
Meperidine Screen, Urine: NEGATIVE ng/mL
Methadone Screen, Urine: NEGATIVE ng/mL
OXYCODONE+OXYMORPHONE UR QL SCN: NEGATIVE ng/mL
Opiate Scrn, Ur: NEGATIVE ng/mL
Ph of Urine: 6.6 (ref 4.5–8.9)
Phencyclidine Qn, Ur: NEGATIVE ng/mL
Propoxyphene Scrn, Ur: NEGATIVE ng/mL
SPECIFIC GRAVITY: 1.017
Tramadol Screen, Urine: NEGATIVE ng/mL

## 2024-02-14 LAB — URINALYSIS, ROUTINE W REFLEX MICROSCOPIC
Bilirubin, UA: NEGATIVE
Glucose, UA: NEGATIVE
Ketones, UA: NEGATIVE
Nitrite, UA: NEGATIVE
Protein,UA: NEGATIVE
RBC, UA: NEGATIVE
Specific Gravity, UA: 1.014 (ref 1.005–1.030)
Urobilinogen, Ur: 0.2 mg/dL (ref 0.2–1.0)
pH, UA: 7 (ref 5.0–7.5)

## 2024-02-14 LAB — MICROSCOPIC EXAMINATION
Bacteria, UA: NONE SEEN
Casts: NONE SEEN /LPF
RBC, Urine: NONE SEEN /HPF (ref 0–2)
WBC, UA: NONE SEEN /HPF (ref 0–5)

## 2024-02-14 LAB — NICOTINE SCREEN, URINE: Cotinine Ql Scrn, Ur: POSITIVE ng/mL — AB

## 2024-02-14 LAB — PROTEIN / CREATININE RATIO, URINE
Creatinine, Urine: 40.2 mg/dL
Protein, Ur: 4.7 mg/dL
Protein/Creat Ratio: 117 mg/g{creat} (ref 0–200)

## 2024-02-15 LAB — CBC/D/PLT+RPR+RH+ABO+RUBIGG...
Antibody Screen: NEGATIVE
Basophils Absolute: 0.1 x10E3/uL (ref 0.0–0.2)
Basos: 1 %
EOS (ABSOLUTE): 0.1 x10E3/uL (ref 0.0–0.4)
Eos: 1 %
HCV Ab: NONREACTIVE
HIV Screen 4th Generation wRfx: NONREACTIVE
Hematocrit: 39.8 % (ref 34.0–46.6)
Hemoglobin: 12.9 g/dL (ref 11.1–15.9)
Hepatitis B Surface Ag: NEGATIVE
Immature Grans (Abs): 0 x10E3/uL (ref 0.0–0.1)
Immature Granulocytes: 0 %
Lymphocytes Absolute: 2.3 x10E3/uL (ref 0.7–3.1)
Lymphs: 21 %
MCH: 29.3 pg (ref 26.6–33.0)
MCHC: 32.4 g/dL (ref 31.5–35.7)
MCV: 90 fL (ref 79–97)
Monocytes Absolute: 0.5 x10E3/uL (ref 0.1–0.9)
Monocytes: 5 %
Neutrophils Absolute: 7.7 x10E3/uL — ABNORMAL HIGH (ref 1.4–7.0)
Neutrophils: 72 %
Platelets: 259 x10E3/uL (ref 150–450)
RBC: 4.41 x10E6/uL (ref 3.77–5.28)
RDW: 12.2 % (ref 11.7–15.4)
RPR Ser Ql: NONREACTIVE
Rh Factor: POSITIVE
Rubella Antibodies, IGG: 2.73 {index}
Varicella zoster IgG: REACTIVE
WBC: 10.6 x10E3/uL (ref 3.4–10.8)

## 2024-02-15 LAB — HGB FRACTIONATION CASCADE
Hgb A2: 2.7 % (ref 1.8–3.2)
Hgb A: 97.3 % (ref 96.4–98.8)
Hgb F: 0 % (ref 0.0–2.0)
Hgb S: 0 %

## 2024-02-15 LAB — HEMOGLOBIN A1C
Est. average glucose Bld gHb Est-mCnc: 100 mg/dL
Hgb A1c MFr Bld: 5.1 % (ref 4.8–5.6)

## 2024-02-15 LAB — COMPREHENSIVE METABOLIC PANEL WITH GFR
ALT: 9 IU/L (ref 0–32)
AST: 9 IU/L (ref 0–40)
Albumin: 4.1 g/dL (ref 4.0–5.0)
Alkaline Phosphatase: 50 IU/L (ref 41–116)
BUN/Creatinine Ratio: 16 (ref 9–23)
BUN: 10 mg/dL (ref 6–20)
Bilirubin Total: <0.2 mg/dL (ref 0.0–1.2)
CO2: 19 mmol/L — ABNORMAL LOW (ref 20–29)
Calcium: 9.7 mg/dL (ref 8.7–10.2)
Chloride: 105 mmol/L (ref 96–106)
Creatinine, Ser: 0.61 mg/dL (ref 0.57–1.00)
Globulin, Total: 2.3 g/dL (ref 1.5–4.5)
Glucose: 72 mg/dL (ref 70–99)
Potassium: 4.6 mmol/L (ref 3.5–5.2)
Sodium: 139 mmol/L (ref 134–144)
Total Protein: 6.4 g/dL (ref 6.0–8.5)
eGFR: 130 mL/min/1.73

## 2024-02-15 LAB — TSH+FREE T4
Free T4: 1.12 ng/dL (ref 0.82–1.77)
TSH: 0.729 u[IU]/mL (ref 0.450–4.500)

## 2024-02-15 LAB — CYTOLOGY - PAP
Chlamydia: NEGATIVE
Comment: NEGATIVE
Comment: NORMAL
Diagnosis: NEGATIVE
Diagnosis: REACTIVE
Neisseria Gonorrhea: NEGATIVE

## 2024-02-15 LAB — HCV INTERPRETATION

## 2024-02-17 LAB — MATERNIT 21 PLUS CORE, BLOOD
Fetal Fraction: 23
Result (T21): NEGATIVE
Trisomy 13 (Patau syndrome): NEGATIVE
Trisomy 18 (Edwards syndrome): NEGATIVE
Trisomy 21 (Down syndrome): NEGATIVE

## 2024-02-17 LAB — URINE CULTURE, OB REFLEX

## 2024-02-17 LAB — CULTURE, OB URINE

## 2024-02-27 ENCOUNTER — Ambulatory Visit

## 2024-03-01 ENCOUNTER — Ambulatory Visit

## 2024-03-12 ENCOUNTER — Encounter: Admitting: Registered Nurse

## 2024-04-09 ENCOUNTER — Other Ambulatory Visit
